# Patient Record
Sex: Female | Born: 1985 | Race: Black or African American | Hispanic: No | Smoking: Never smoker
Health system: Southern US, Community
[De-identification: ages and names within clinical notes are randomized; demographics above are authoritative.]

## PROBLEM LIST (undated history)

## (undated) DIAGNOSIS — D573 Sickle-cell trait: Secondary | ICD-10-CM

## (undated) DIAGNOSIS — R87629 Unspecified abnormal cytological findings in specimens from vagina: Secondary | ICD-10-CM

## (undated) HISTORY — DX: Unspecified abnormal cytological findings in specimens from vagina: R87.629

## (undated) HISTORY — DX: Sickle-cell trait: D57.3

---
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
## 2006-02-16 ENCOUNTER — Emergency Department: Payer: Self-pay | Admitting: Emergency Medicine

## 2006-03-29 ENCOUNTER — Emergency Department: Payer: Self-pay | Admitting: General Practice

## 2006-05-11 ENCOUNTER — Emergency Department: Payer: Self-pay | Admitting: Emergency Medicine

## 2006-06-08 ENCOUNTER — Observation Stay: Payer: Self-pay

## 2006-09-28 ENCOUNTER — Inpatient Hospital Stay: Payer: Self-pay | Admitting: Obstetrics and Gynecology

## 2007-06-23 ENCOUNTER — Emergency Department: Payer: Self-pay | Admitting: Emergency Medicine

## 2007-06-24 ENCOUNTER — Emergency Department: Payer: Self-pay | Admitting: Emergency Medicine

## 2008-11-09 ENCOUNTER — Emergency Department: Payer: Self-pay | Admitting: Emergency Medicine

## 2009-01-17 ENCOUNTER — Ambulatory Visit: Payer: Self-pay | Admitting: Family Medicine

## 2010-01-05 HISTORY — PX: TONSILLECTOMY: SUR1361

## 2010-10-09 IMAGING — CT CT NECK WITH CONTRAST
1 of 2 series · 9 of 14 positions shown, 12 images · non-contrast
Comparison: none

REASON FOR EXAM: increasing throat pain, swelling, pus sp tonsillectomy
COMMENTS:

[Series 3: soft tissue · axial · 0.45mm/px · z∈[-346,-130]mm · 9 of 90 slices shown, 12 images]
[im 9/90  soft-tissue]
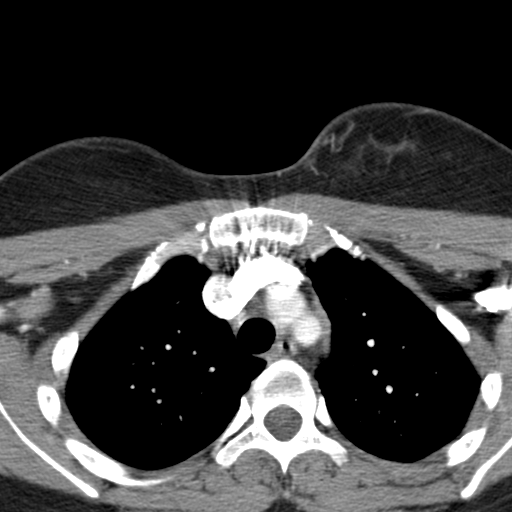
[im 9/90  bone]
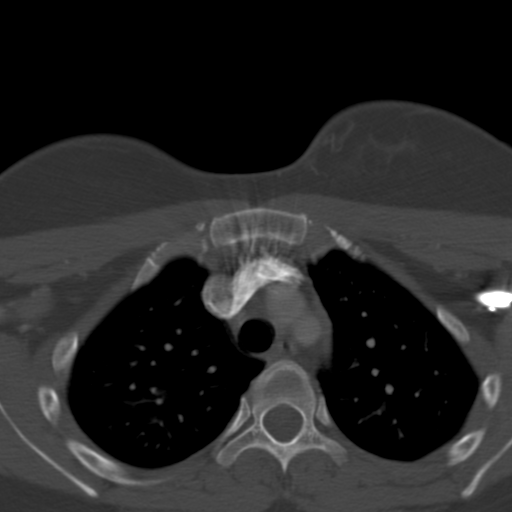
[im 18/90  bone]
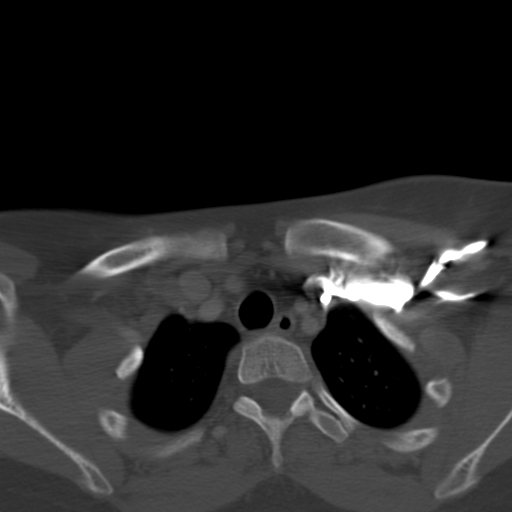
[im 27/90  bone]
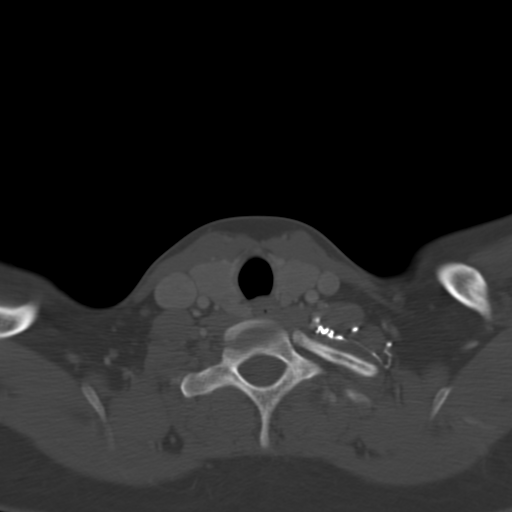
[im 36/90  bone]
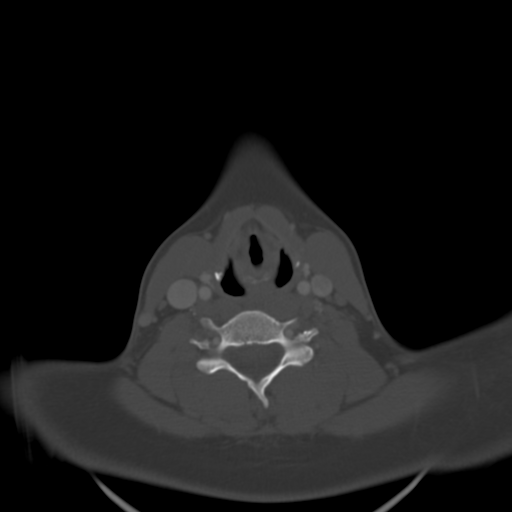
[im 45/90  soft-tissue]
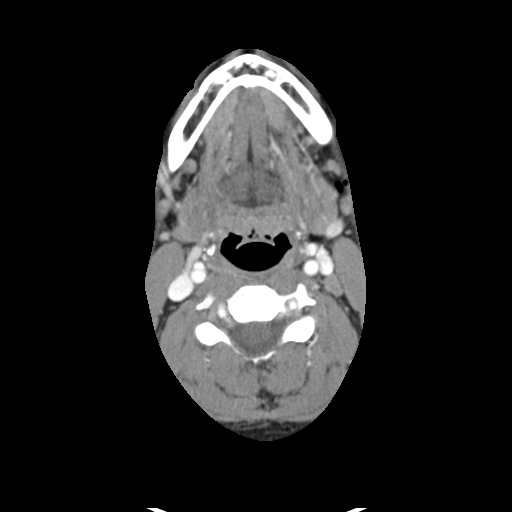
[im 45/90  bone]
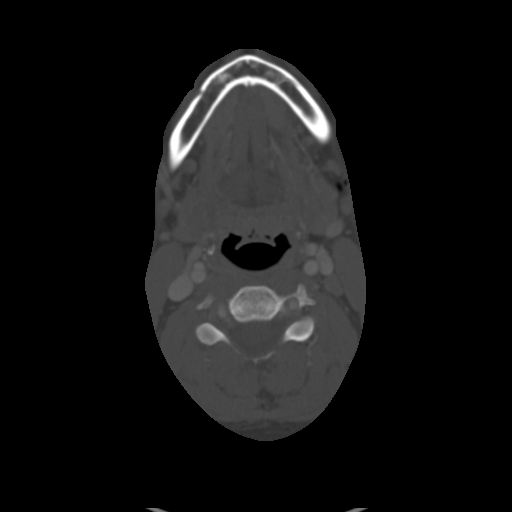
[im 54/90  bone]
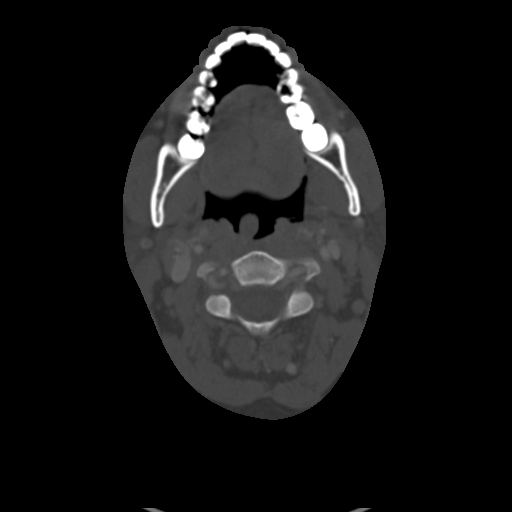
[im 63/90  bone]
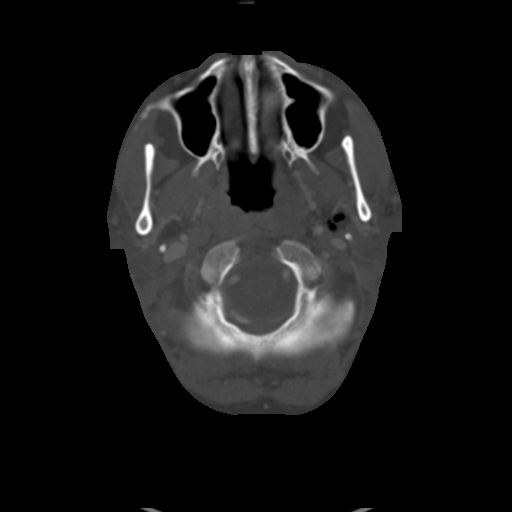
[im 72/90  bone]
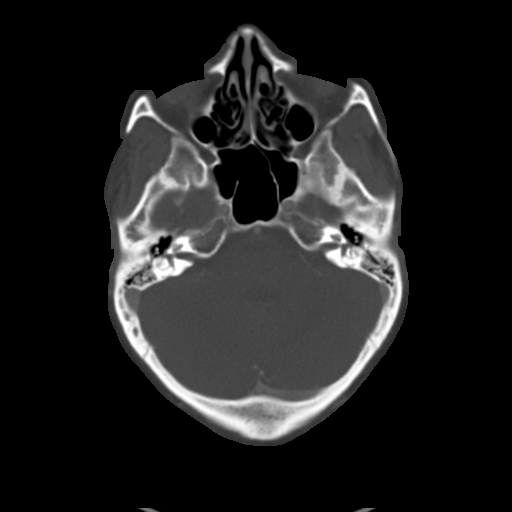
[im 81/90  soft-tissue]
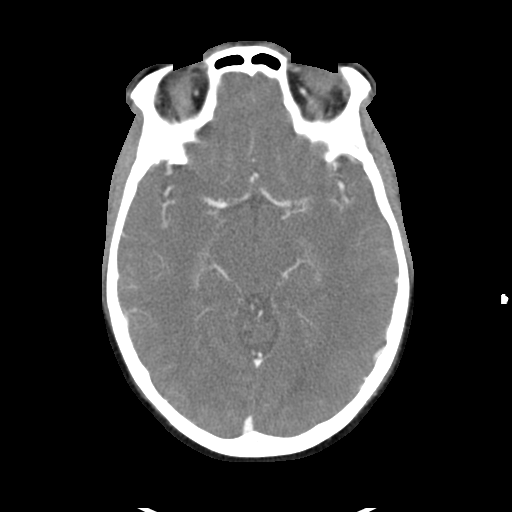
[im 81/90  bone]
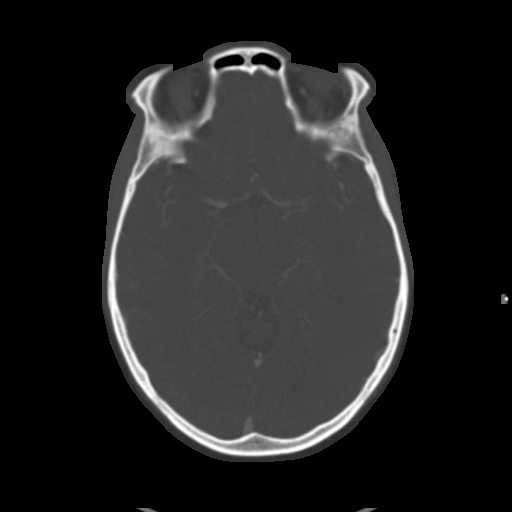

[9 of 14 positions shown; findings below may reference images not displayed]

PROCEDURE:     CT  - CT NECK WITH CONTRAST  - November 09, 2008 [DATE]

RESULT:     Emergent CT of the neck is performed utilizing 70 ml of
Csovue-N25 iodinated intravenous contrast. Images are reconstructed at 3 mm
slice thickness. There is no previous exam for comparison.

Images through the base of the brain show no significant abnormality. The
included sinuses and mastoids show normal aeration. The parotid glands,
submandibular glands and thyroid lobes appear unremarkable. The superior
mediastinum is within normal limits. The apical portions of the lungs are
clear. There is no narrowing of the airway. The larynx appears unremarkable.
There is no abscess evident. The uvula is slightly prominent. This is
nonspecific. Correlate clinically. There is a small mucous retention cyst in
the left maxillary sinus. The prevertebral soft tissues are normal. There is
a tiny amount of air in the right parapharyngeal space which could be
secondary to the recent tonsillectomy. There is some air in the soft tissues
medial to the mandibular angle and ramus extending inferiorly to the
superficial area over the left submandibular gland. Again, there is no
evidence of an abscess associated with these air collections. No other fluid
collection such as a hematoma or seroma is evident. The jugular veins and
common carotid arteries appear to be unremarkable. The visualized internal
carotids appear to be within normal limits.
IMPRESSION: No evidence of an abscess. There is air within the soft
tissues as described. This could be secondary to recent surgery. Infection
is not completely excluded. At this point, however, there is no evidence of
an abscess or hematoma. The uvula is somewhat prominent. Correlate
clinically.

## 2011-03-28 ENCOUNTER — Emergency Department: Payer: Self-pay | Admitting: Emergency Medicine

## 2011-08-16 ENCOUNTER — Emergency Department: Payer: Self-pay | Admitting: Internal Medicine

## 2014-07-04 ENCOUNTER — Encounter: Payer: Self-pay | Admitting: Emergency Medicine

## 2014-07-04 ENCOUNTER — Emergency Department
Admission: EM | Admit: 2014-07-04 | Discharge: 2014-07-04 | Disposition: A | Discharge status: Home / Self Care | Payer: Medicaid Other | Attending: Emergency Medicine | Admitting: Emergency Medicine

## 2014-07-04 DIAGNOSIS — Z3202 Encounter for pregnancy test, result negative: Secondary | ICD-10-CM | POA: Diagnosis not present

## 2014-07-04 DIAGNOSIS — N390 Urinary tract infection, site not specified: Secondary | ICD-10-CM

## 2014-07-04 DIAGNOSIS — E876 Hypokalemia: Secondary | ICD-10-CM | POA: Diagnosis not present

## 2014-07-04 DIAGNOSIS — R300 Dysuria: Secondary | ICD-10-CM | POA: Diagnosis present

## 2014-07-04 DIAGNOSIS — N12 Tubulo-interstitial nephritis, not specified as acute or chronic: Secondary | ICD-10-CM

## 2014-07-04 LAB — CBC WITH DIFFERENTIAL/PLATELET
BASOS PCT: 0 %
Basophils Absolute: 0 10*3/uL (ref 0–0.1)
EOS ABS: 0 10*3/uL (ref 0–0.7)
EOS PCT: 0 %
HCT: 39.7 % (ref 35.0–47.0)
Hemoglobin: 12.8 g/dL (ref 12.0–16.0)
Lymphocytes Relative: 6 %
Lymphs Abs: 0.8 10*3/uL — ABNORMAL LOW (ref 1.0–3.6)
MCH: 28.6 pg (ref 26.0–34.0)
MCHC: 32.2 g/dL (ref 32.0–36.0)
MCV: 88.7 fL (ref 80.0–100.0)
MONOS PCT: 8 %
Monocytes Absolute: 1.1 10*3/uL — ABNORMAL HIGH (ref 0.2–0.9)
Neutro Abs: 11.1 10*3/uL — ABNORMAL HIGH (ref 1.4–6.5)
Neutrophils Relative %: 86 %
PLATELETS: 304 10*3/uL (ref 150–440)
RBC: 4.47 MIL/uL (ref 3.80–5.20)
RDW: 13.3 % (ref 11.5–14.5)
WBC: 13 10*3/uL — ABNORMAL HIGH (ref 3.6–11.0)

## 2014-07-04 LAB — URINALYSIS COMPLETE WITH MICROSCOPIC (ARMC ONLY)
BILIRUBIN URINE: NEGATIVE
Glucose, UA: >500 mg/dL — AB
KETONES UR: NEGATIVE mg/dL
Nitrite: NEGATIVE
Protein, ur: NEGATIVE mg/dL
Specific Gravity, Urine: 1.006 (ref 1.005–1.030)
Squamous Epithelial / LPF: NONE SEEN
pH: 6 (ref 5.0–8.0)

## 2014-07-04 LAB — BASIC METABOLIC PANEL
ANION GAP: 10 (ref 5–15)
BUN: 6 mg/dL (ref 6–20)
CALCIUM: 9 mg/dL (ref 8.9–10.3)
CO2: 24 mmol/L (ref 22–32)
CREATININE: 0.77 mg/dL (ref 0.44–1.00)
Chloride: 105 mmol/L (ref 101–111)
GFR calc non Af Amer: >60 mL/min (ref >60)
Glucose, Bld: 117 mg/dL — ABNORMAL HIGH (ref 65–99)
Potassium: 3.2 mmol/L — ABNORMAL LOW (ref 3.5–5.1)
Sodium: 139 mmol/L (ref 135–145)

## 2014-07-04 LAB — POCT PREGNANCY, URINE: PREG TEST UR: NEGATIVE

## 2014-07-04 LAB — GLUCOSE, CAPILLARY: Glucose-Capillary: 86 mg/dL (ref 65–99)

## 2014-07-04 MED ORDER — CEFTRIAXONE SODIUM IN DEXTROSE 20 MG/ML IV SOLN
INTRAVENOUS | Status: AC
  Filled 2014-07-04: qty 50

## 2014-07-04 MED ORDER — PHENAZOPYRIDINE HCL 200 MG PO TABS: 200.0000 mg | ORAL_TABLET | Freq: Three times a day (TID) | ORAL | Status: DC | PRN

## 2014-07-04 MED ORDER — SODIUM CHLORIDE 0.9 % IV BOLUS (SEPSIS)
1000.0000 mL | Freq: Once | INTRAVENOUS | Status: AC
  Administered 2014-07-04: 1000 mL via INTRAVENOUS

## 2014-07-04 MED ORDER — KETOROLAC TROMETHAMINE 30 MG/ML IJ SOLN
30.0000 mg | Freq: Once | INTRAMUSCULAR | Status: AC
  Administered 2014-07-04: 30 mg via INTRAVENOUS

## 2014-07-04 MED ORDER — CIPROFLOXACIN HCL 500 MG PO TABS: 500.0000 mg | ORAL_TABLET | Freq: Two times a day (BID) | ORAL | Status: AC

## 2014-07-04 MED ORDER — KETOROLAC TROMETHAMINE 10 MG PO TABS: 10.0000 mg | ORAL_TABLET | Freq: Four times a day (QID) | ORAL | Status: DC | PRN

## 2014-07-04 MED ORDER — CEFTRIAXONE SODIUM IN DEXTROSE 20 MG/ML IV SOLN
1.0000 g | INTRAVENOUS | Status: DC
  Administered 2014-07-04: 1 g via INTRAVENOUS

## 2014-07-04 MED ORDER — POTASSIUM CHLORIDE ER 10 MEQ PO TBCR: 10.0000 meq | EXTENDED_RELEASE_TABLET | Freq: Every day | ORAL | Status: DC

## 2014-07-04 NOTE — ED Notes (Signed)
Pt c/o burning upon urination with urinary frequency x 1 week, states yesterday she had chills and body aches

## 2014-07-04 NOTE — ED Notes (Signed)
Pt complains of right sided flank pain, pt states burning and malodors urine for 1 week, pt alert and oriented upon assessment

## 2014-07-04 NOTE — ED Provider Notes (Signed)
[facility] Emergency Department Provider Note  ____________________________________________  Time seen: 1042   I have reviewed the triage vital signs and the nursing notes.   HISTORY  Chief Complaint Dysuria and Fever    HPI Heather Barrett is a 29 y.o. female who is had a week's worth of burning with urination now she states that she has pain up in her right side fevers chills sweats denies vaginal bleeding or discharge says her body started aching she has some muscle cramping rates her overall pain and discomfort as about 8 out of 10 nothing making it better or worse denies nausea vomiting diarrhea or any other complaints of note here today for further evaluation and treatment   No past medical history on file.  There are no active problems to display for this patient.   No past surgical history on file.  Current Outpatient Rx  Name  Route  Sig  Dispense  Refill  . ciprofloxacin (CIPRO) 500 MG tablet   Oral   Take 1 tablet (500 mg total) by mouth 2 (two) times daily.   14 tablet   0   . ketorolac (TORADOL) 10 MG tablet   Oral   Take 1 tablet (10 mg total) by mouth every 6 (six) hours as needed.   20 tablet   0   . phenazopyridine (PYRIDIUM) 200 MG tablet   Oral   Take 1 tablet (200 mg total) by mouth 3 (three) times daily as needed for pain.   10 tablet   0   . potassium chloride (K-DUR) 10 MEQ tablet   Oral   Take 1 tablet (10 mEq total) by mouth daily.   5 tablet   0     Allergies Review of patient's allergies indicates no known allergies.  No family history on file.  Social History History  Substance Use Topics  . Smoking status: Never Smoker   . Smokeless tobacco: Not on file  . Alcohol Use: Yes    Review of Systems Constitutional: No fever/chills Eyes: No visual changes. ENT: No sore throat. Cardiovascular: Denies chest pain. Respiratory: Denies shortness of breath. Gastrointestinal: No abdominal pain.  No  nausea, no vomiting.  No diarrhea.  No constipation. Genitourinary: dysuria. Musculoskeletal: Negative for back pain. Skin: Negative for rash. Neurological: Negative for headaches, focal weakness or numbness.  10-point ROS otherwise negative.  ____________________________________________   PHYSICAL EXAM:  VITAL SIGNS: ED Triage Vitals  Enc Vitals Group     BP 07/04/14 1034 131/91 mmHg     Pulse Rate 07/04/14 1034 116     Resp 07/04/14 1034 18     Temp 07/04/14 1034 100.2 F (37.9 C)     Temp Source 07/04/14 1034 Oral     SpO2 07/04/14 1034 100 %     Weight 07/04/14 1034 157 lb (71.215 kg)     Height 07/04/14 1034  (1.676 m)     Head Cir --      Peak Flow --      Pain Score 07/04/14 1035 7     Pain Loc --      Pain Edu? --      Excl. in GC? --     Constitutional: Alert and oriented. Well appearing and in no acute distress. Eyes: Conjunctivae are normal. PERRL. EOMI. Head: Atraumatic. Nose: No congestion/rhinnorhea. Mouth/Throat: Mucous membranes are moist.  Oropharynx non-erythematous. Neck: No stridor.   Cardiovascular: Normal rate, regular rhythm. Grossly normal heart sounds.  Good peripheral circulation.  Respiratory: Normal respiratory effort.  No retractions. Lungs CTAB. Gastrointestinal: Soft and suprapubic pain. No distention. No abdominal bruits. right CVA tenderness. Musculoskeletal: No lower extremity tenderness nor edema.  No joint effusions. Neurologic:  Normal speech and language. No gross focal neurologic deficits are appreciated. Speech is normal. No gait instability. Skin:  Skin is warm, dry and intact. No rash noted. Psychiatric: Mood and affect are normal. Speech and behavior are normal.  ____________________________________________   LABS (all labs ordered are listed, but only abnormal results are displayed)  Labs Reviewed  URINALYSIS COMPLETEWITH MICROSCOPIC ([facility] ONLY) - Abnormal; Notable for the following:    Color, Urine YELLOW (*)     APPearance CLOUDY (*)    Glucose, UA >500 (*)    Hgb urine dipstick 2+ (*)    Leukocytes, UA 3+ (*)    Bacteria, UA FEW (*)    All other components within normal limits  BASIC METABOLIC PANEL - Abnormal; Notable for the following:    Potassium 3.2 (*)    Glucose, Bld 117 (*)    All other components within normal limits  CBC WITH DIFFERENTIAL/PLATELET - Abnormal; Notable for the following:    WBC 13.0 (*)    Neutro Abs 11.1 (*)    Lymphs Abs 0.8 (*)    Monocytes Absolute 1.1 (*)    All other components within normal limits  URINE CULTURE  GLUCOSE, CAPILLARY  POCT PREGNANCY, URINE    PROCEDURES  Procedure(s) performed: None  Critical Care performed: No  ____________________________________________   INITIAL IMPRESSION / ASSESSMENT AND PLAN / ED COURSE  Pertinent labs & imaging results that were available during my care of the patient were reviewed by me and considered in my medical decision making (see chart for details).  Initial impression on this patient pyelonephritis given her symptoms fever tachycardia and exam we started her with fluids and IV antibiotics medication for pain and discomfort she is to follow-up in 2-3 days return here for any acute concerns or worsening symptoms given prescriptions for Cipro Toradol Pyridium and K-Dur ____________________________________________   FINAL CLINICAL IMPRESSION(S) / ED DIAGNOSES  Final diagnoses:  UTI (lower urinary tract infection)  Pyelonephritis  Hypokalemia     Roberts Bon Rosalyn GessWilliam C Nyia Tsao, PA-C 07/04/14 1318  Phineas SemenGraydon Goodman, MD 07/04/14 1353

## 2014-07-05 ENCOUNTER — Encounter: Payer: Self-pay | Admitting: General Practice

## 2014-07-05 ENCOUNTER — Emergency Department
Admission: EM | Admit: 2014-07-05 | Discharge: 2014-07-05 | Disposition: A | Discharge status: Home / Self Care | Payer: Medicaid Other | Attending: Emergency Medicine | Admitting: Emergency Medicine

## 2014-07-05 DIAGNOSIS — R509 Fever, unspecified: Secondary | ICD-10-CM

## 2014-07-05 DIAGNOSIS — N390 Urinary tract infection, site not specified: Secondary | ICD-10-CM | POA: Diagnosis not present

## 2014-07-05 DIAGNOSIS — R51 Headache: Secondary | ICD-10-CM | POA: Diagnosis not present

## 2014-07-05 DIAGNOSIS — Z79899 Other long term (current) drug therapy: Secondary | ICD-10-CM | POA: Diagnosis not present

## 2014-07-05 DIAGNOSIS — Z3202 Encounter for pregnancy test, result negative: Secondary | ICD-10-CM | POA: Diagnosis not present

## 2014-07-05 DIAGNOSIS — Z792 Long term (current) use of antibiotics: Secondary | ICD-10-CM | POA: Insufficient documentation

## 2014-07-05 LAB — URINALYSIS COMPLETE WITH MICROSCOPIC (ARMC ONLY)
Bilirubin Urine: NEGATIVE
Glucose, UA: 50 mg/dL — AB
Ketones, ur: NEGATIVE mg/dL
Nitrite: NEGATIVE
PH: 6 (ref 5.0–8.0)
Protein, ur: 30 mg/dL — AB
SPECIFIC GRAVITY, URINE: 1.01 (ref 1.005–1.030)

## 2014-07-05 LAB — CBC
HCT: 37.5 % (ref 35.0–47.0)
Hemoglobin: 12.6 g/dL (ref 12.0–16.0)
MCH: 29.1 pg (ref 26.0–34.0)
MCHC: 33.5 g/dL (ref 32.0–36.0)
MCV: 86.8 fL (ref 80.0–100.0)
Platelets: 258 10*3/uL (ref 150–440)
RBC: 4.32 MIL/uL (ref 3.80–5.20)
RDW: 13.3 % (ref 11.5–14.5)
WBC: 9.1 10*3/uL (ref 3.6–11.0)

## 2014-07-05 LAB — BASIC METABOLIC PANEL
ANION GAP: 8 (ref 5–15)
BUN: 6 mg/dL (ref 6–20)
CALCIUM: 8.6 mg/dL — AB (ref 8.9–10.3)
CO2: 24 mmol/L (ref 22–32)
CREATININE: 0.73 mg/dL (ref 0.44–1.00)
Chloride: 105 mmol/L (ref 101–111)
GFR calc non Af Amer: >60 mL/min (ref >60)
GLUCOSE: 99 mg/dL (ref 65–99)
POTASSIUM: 3.2 mmol/L — AB (ref 3.5–5.1)
SODIUM: 137 mmol/L (ref 135–145)

## 2014-07-05 LAB — POCT PREGNANCY, URINE: PREG TEST UR: NEGATIVE

## 2014-07-05 MED ORDER — ACETAMINOPHEN 500 MG PO TABS
ORAL_TABLET | ORAL | Status: AC
  Filled 2014-07-05: qty 1

## 2014-07-05 MED ORDER — NITROFURANTOIN MACROCRYSTAL 100 MG PO CAPS
ORAL_CAPSULE | ORAL | Status: AC
  Filled 2014-07-05: qty 1

## 2014-07-05 MED ORDER — KETOROLAC TROMETHAMINE 30 MG/ML IJ SOLN
30.0000 mg | Freq: Once | INTRAMUSCULAR | Status: AC
  Administered 2014-07-05: 30 mg via INTRAVENOUS

## 2014-07-05 MED ORDER — SODIUM CHLORIDE 0.9 % IV BOLUS (SEPSIS)
1000.0000 mL | Freq: Once | INTRAVENOUS | Status: AC
  Administered 2014-07-05: 1000 mL via INTRAVENOUS

## 2014-07-05 MED ORDER — ACETAMINOPHEN 500 MG PO TABS
500.0000 mg | ORAL_TABLET | Freq: Once | ORAL | Status: AC
  Administered 2014-07-05: 500 mg via ORAL

## 2014-07-05 MED ORDER — NITROFURANTOIN MONOHYD MACRO 100 MG PO CAPS: 100.0000 mg | ORAL_CAPSULE | Freq: Two times a day (BID) | ORAL | Status: AC

## 2014-07-05 MED ORDER — NITROFURANTOIN MACROCRYSTAL 100 MG PO CAPS
ORAL_CAPSULE | ORAL | Status: DC
Start: 2014-07-05 — End: 2014-07-06
  Filled 2014-07-05: qty 1

## 2014-07-05 MED ORDER — KETOROLAC TROMETHAMINE 30 MG/ML IJ SOLN
INTRAMUSCULAR | Status: AC
  Administered 2014-07-05: 30 mg via INTRAVENOUS
  Filled 2014-07-05: qty 1

## 2014-07-05 MED ORDER — NITROFURANTOIN MONOHYD MACRO 100 MG PO CAPS
100.0000 mg | ORAL_CAPSULE | Freq: Once | ORAL | Status: AC
  Administered 2014-07-05: 100 mg via ORAL

## 2014-07-05 NOTE — Discharge Instructions (Signed)
Please seek medical attention for any high fevers, chest pain, shortness of breath, change in behavior, persistent vomiting, bloody stool or any other new or concerning symptoms. ° °Urinary Tract Infection °Urinary tract infections (UTIs) can develop anywhere along your urinary tract. Your urinary tract is your body's drainage system for removing wastes and extra water. Your urinary tract includes two kidneys, two ureters, a bladder, and a urethra. Your kidneys are a pair of bean-shaped organs. Each kidney is about the size of your fist. They are located below your ribs, one on each side of your spine. °CAUSES °Infections are caused by microbes, which are microscopic organisms, including fungi, viruses, and bacteria. These organisms are so small that they can only be seen through a microscope. Bacteria are the microbes that most commonly cause UTIs. °SYMPTOMS  °Symptoms of UTIs may vary by age and gender of the patient and by the location of the infection. Symptoms in young women typically include a frequent and intense urge to urinate and a painful, burning feeling in the bladder or urethra during urination. Older women and men are more likely to be tired, shaky, and weak and have muscle aches and abdominal pain. A fever may mean the infection is in your kidneys. Other symptoms of a kidney infection include pain in your back or sides below the ribs, nausea, and vomiting. °DIAGNOSIS °To diagnose a UTI, your caregiver will ask you about your symptoms. Your caregiver also will ask to provide a urine sample. The urine sample will be tested for bacteria and white blood cells. White blood cells are made by your body to help fight infection. °TREATMENT  °Typically, UTIs can be treated with medication. Because most UTIs are caused by a bacterial infection, they usually can be treated with the use of antibiotics. The choice of antibiotic and length of treatment depend on your symptoms and the type of bacteria causing your  infection. °HOME CARE INSTRUCTIONS °· If you were prescribed antibiotics, take them exactly as your caregiver instructs you. Finish the medication even if you feel better after you have only taken some of the medication. °· Drink enough water and fluids to keep your urine clear or pale yellow. °· Avoid caffeine, tea, and carbonated beverages. They tend to irritate your bladder. °· Empty your bladder often. Avoid holding urine for long periods of time. °· Empty your bladder before and after sexual intercourse. °· After a bowel movement, women should cleanse from front to back. Use each tissue only once. °SEEK MEDICAL CARE IF:  °· You have back pain. °· You develop a fever. °· Your symptoms do not begin to resolve within 3 days. °SEEK IMMEDIATE MEDICAL CARE IF:  °· You have severe back pain or lower abdominal pain. °· You develop chills. °· You have nausea or vomiting. °· You have continued burning or discomfort with urination. °MAKE SURE YOU:  °· Understand these instructions. °· Will watch your condition. °· Will get help right away if you are not doing well or get worse. °Document Released: 10/01/2004 Document Revised: 06/23/2011 Document Reviewed: 01/30/2011 °[employer]® Patient Information ©2015 [employer]. This information is not intended to replace advice given to you by your health care provider. Make sure you discuss any questions you have with your health care provider. ° °

## 2014-07-05 NOTE — ED Provider Notes (Signed)
[facility] Emergency Department Provider Note   ____________________________________________  Time seen: 1930  I have reviewed the triage vital signs and the nursing notes.   HISTORY  Chief Complaint Fever and Headache   History limited by: Not Limited   HPI [name] is a 29 y.o. female who presents to the emergency department because of concerns for fever, weakness and headache. The patient was seen in the emergency department yesterday and diagnosed with UTI. She states she's been taking the medications as prescribed and does have some improvement of the dysuria. However she states that she continues to have fevers. She has been trying Tylenol at home with only temporary relief fevers. Additionally she now says she is feeling weak especially when she stands up. She thinks she has been drinking enough.    History reviewed. No pertinent past medical history.  There are no active problems to display for this patient.   Past Surgical History  Procedure Laterality Date  . Cesarean section      Current Outpatient Rx  Name  Route  Sig  Dispense  Refill  . ciprofloxacin (CIPRO) 500 MG tablet   Oral   Take 1 tablet (500 mg total) by mouth 2 (two) times daily.   14 tablet   0   . ketorolac (TORADOL) 10 MG tablet   Oral   Take 1 tablet (10 mg total) by mouth every 6 (six) hours as needed.   20 tablet   0   . phenazopyridine (PYRIDIUM) 200 MG tablet   Oral   Take 1 tablet (200 mg total) by mouth 3 (three) times daily as needed for pain.   10 tablet   0   . potassium chloride (K-DUR) 10 MEQ tablet   Oral   Take 1 tablet (10 mEq total) by mouth daily.   5 tablet   0     Allergies Review of patient's allergies indicates no known allergies.  No family history on file.  Social History History  Substance Use Topics  . Smoking status: Never Smoker   . Smokeless tobacco: Not on file  . Alcohol Use: Yes    Review of  Systems  Constitutional: Negative for fever. Cardiovascular: Negative for chest pain. Respiratory: Negative for shortness of breath. Gastrointestinal: Negative for abdominal pain, vomiting and diarrhea. Genitourinary: Dysuria is improving Musculoskeletal: Negative for back pain. Skin: Negative for rash. Neurological: Negative for headaches, focal weakness or numbness.  10-point ROS otherwise negative.  ____________________________________________   PHYSICAL EXAM:  VITAL SIGNS: ED Triage Vitals  Enc Vitals Group     BP 07/05/14 1836 125/79 mmHg     Pulse Rate 07/05/14 1836 110     Resp 07/05/14 1836 18     Temp 07/05/14 1836 100.6 F (38.1 C)     Temp Source 07/05/14 1836 Oral     SpO2 07/05/14 1836 99 %     Weight 07/05/14 1836 157 lb (71.215 kg)     Height 07/05/14 1836 5\' 6"  (1.676 m)     Head Cir --      Peak Flow --      Pain Score 07/05/14 1837 7   Constitutional: Alert and oriented. Well appearing and in no distress. Eyes: Conjunctivae are normal. PERRL. Normal extraocular movements. ENT   Head: Normocephalic and atraumatic.   Nose: No congestion/rhinnorhea.   Mouth/Throat: Mucous membranes are moist.   Neck: No stridor. Hematological/Lymphatic/Immunilogical: No cervical lymphadenopathy. Cardiovascular: Normal rate, regular rhythm.  No murmurs, rubs, or  gallops. Respiratory: Normal respiratory effort without tachypnea nor retractions. Breath sounds are clear and equal bilaterally. No wheezes/rales/rhonchi. Gastrointestinal: Soft and nontender. No distention. There is no CVA tenderness. Genitourinary: Deferred Musculoskeletal: Normal range of motion in all extremities. No joint effusions.  No lower extremity tenderness nor edema. Neurologic:  Normal speech and language. No gross focal neurologic deficits are appreciated. Speech is normal.  Skin:  Skin is warm, dry and intact. No rash noted. Psychiatric: Mood and affect are normal. Speech and behavior  are normal. Patient exhibits appropriate insight and judgment.  ____________________________________________    LABS (pertinent positives/negatives)  Labs Reviewed  BASIC METABOLIC PANEL - Abnormal; Notable for the following:    Potassium 3.2 (*)    Calcium 8.6 (*)    All other components within normal limits  URINALYSIS COMPLETEWITH MICROSCOPIC ([facility] ONLY) - Abnormal; Notable for the following:    Color, Urine YELLOW (*)    APPearance CLEAR (*)    Glucose, UA 50 (*)    Hgb urine dipstick 2+ (*)    Protein, ur 30 (*)    Leukocytes, UA TRACE (*)    Bacteria, UA RARE (*)    Squamous Epithelial / LPF 0-5 (*)    All other components within normal limits  CBC  POC URINE PREG, ED  POCT PREGNANCY, URINE     ____________________________________________   EKG  None  ____________________________________________    RADIOLOGY  None  ____________________________________________   PROCEDURES  Procedure(s) performed: None  Critical Care performed: No  ____________________________________________   INITIAL IMPRESSION / ASSESSMENT AND PLAN / ED COURSE  Pertinent labs & imaging results that were available during my care of the patient were reviewed by me and considered in my medical decision making (see chart for details).  She presents with continued fever, headache and pain. Patient was diagnosed with UTI yesterday and placed on Cipro. On exam patient no acute distress. No signs of meningismus. Patient did feel better after fluids and Toradol. Will switch patient to Macrobid. Did discuss possibility of meningitis with patient however at this point I think this possibly very low thus will defer spinal tap.  ____________________________________________   FINAL CLINICAL IMPRESSION(S) / ED DIAGNOSES  Final diagnoses:  UTI (lower urinary tract infection)  Fever, unspecified fever cause     Phineas Semen, MD 07/05/14 2123

## 2014-07-05 NOTE — ED Notes (Signed)
Pt. Arrived to ed from home with reports of experiencing a headache and fever. Pt states she was here yesterday and is currently being treated for a UTI and hypokalemia. Pt states "i aint getting no better, i can not break this fever".  Pt alert and oriented. Pt verbalized improvement in urinary symptoms but states "my head still hurts and my fever". Pt placed on oral antibiotics and potassium pills.

## 2014-07-05 NOTE — ED Notes (Signed)
Pt. Was seen here yesterday and dx with UTI and hypokaliemia.  Pt. States having HA and fever for the past two days.  Pt. States taking Tylenol to treat fever and HA.  Pt. States no relief for fever and HA.  Pt. States having some relief from UTI symptoms.

## 2014-07-06 ENCOUNTER — Telehealth: Payer: Self-pay | Admitting: Emergency Medicine

## 2014-07-06 LAB — URINE CULTURE: Culture: >=100000

## 2014-07-06 NOTE — ED Provider Notes (Signed)
-----------------------------------------   9:29 AM on 07/06/2014 -----------------------------------------  Urine culture reviewed. Records are reviewed.  Patient initially treated with ciprofloxacin. The patient return to the emergency department due to ongoing fevers and was changed from Cipro to Macrobid. Of note, her initial urinalysis showed white blood cells too numerous to count, and the second urinalysis showed white blood cells 6-30. It appeared she was having improvement with the Cipro. The culture now returns with ESBL Escherichia coli that is intermittent resistant to Macrobid. The culture does not make any comment about fluoroquinolones. I am asking the nursing staff to call the patient and asked her to restart the Cipro and take it in conjunction with the Macrobid given the culture results. The last patient to follow-up with a primary physician for further evaluation and ongoing care.   Darien Ramusavid W Nhia Heaphy, MD 07/06/14 0930

## 2014-07-06 NOTE — ED Notes (Signed)
Urine culture reviewed by Dr Carollee MassedKaminski.  Called patient at request of Dr. Carollee MassedKaminski to instruct to take both cipro and macrobid rxs until complete.  Left message with instructions, and asked her to call me back today if possible.

## 2015-05-14 ENCOUNTER — Emergency Department
Admission: EM | Admit: 2015-05-14 | Discharge: 2015-05-14 | Disposition: A | Discharge status: Home / Self Care | Payer: Medicaid Other | Attending: Emergency Medicine | Admitting: Emergency Medicine

## 2015-05-14 DIAGNOSIS — Z79899 Other long term (current) drug therapy: Secondary | ICD-10-CM | POA: Insufficient documentation

## 2015-05-14 DIAGNOSIS — B9689 Other specified bacterial agents as the cause of diseases classified elsewhere: Secondary | ICD-10-CM

## 2015-05-14 DIAGNOSIS — N760 Acute vaginitis: Secondary | ICD-10-CM | POA: Insufficient documentation

## 2015-05-14 DIAGNOSIS — N3000 Acute cystitis without hematuria: Secondary | ICD-10-CM

## 2015-05-14 LAB — COMPREHENSIVE METABOLIC PANEL
ALT: 17 U/L (ref 14–54)
AST: 18 U/L (ref 15–41)
Albumin: 4.5 g/dL (ref 3.5–5.0)
Alkaline Phosphatase: 78 U/L (ref 38–126)
Anion gap: 7 (ref 5–15)
BUN: 12 mg/dL (ref 6–20)
CALCIUM: 9.7 mg/dL (ref 8.9–10.3)
CO2: 26 mmol/L (ref 22–32)
CREATININE: 0.72 mg/dL (ref 0.44–1.00)
Chloride: 106 mmol/L (ref 101–111)
GFR calc Af Amer: >60 mL/min (ref >60)
GFR calc non Af Amer: >60 mL/min (ref >60)
GLUCOSE: 95 mg/dL (ref 65–99)
Potassium: 3.2 mmol/L — ABNORMAL LOW (ref 3.5–5.1)
Sodium: 139 mmol/L (ref 135–145)
Total Bilirubin: 0.7 mg/dL (ref 0.3–1.2)
Total Protein: 7.7 g/dL (ref 6.5–8.1)

## 2015-05-14 LAB — CBC
HCT: 44.8 % (ref 35.0–47.0)
Hemoglobin: 14.6 g/dL (ref 12.0–16.0)
MCH: 28.9 pg (ref 26.0–34.0)
MCHC: 32.6 g/dL (ref 32.0–36.0)
MCV: 88.7 fL (ref 80.0–100.0)
PLATELETS: 281 10*3/uL (ref 150–440)
RBC: 5.05 MIL/uL (ref 3.80–5.20)
RDW: 13.8 % (ref 11.5–14.5)
WBC: 10.4 10*3/uL (ref 3.6–11.0)

## 2015-05-14 LAB — URINALYSIS COMPLETE WITH MICROSCOPIC (ARMC ONLY)
Bilirubin Urine: NEGATIVE
GLUCOSE, UA: NEGATIVE mg/dL
Ketones, ur: NEGATIVE mg/dL
Nitrite: NEGATIVE
PH: 5 (ref 5.0–8.0)
Protein, ur: NEGATIVE mg/dL
Specific Gravity, Urine: 1.012 (ref 1.005–1.030)

## 2015-05-14 LAB — WET PREP, GENITAL
Sperm: NONE SEEN
Trich, Wet Prep: NONE SEEN
Yeast Wet Prep HPF POC: NONE SEEN

## 2015-05-14 LAB — POCT PREGNANCY, URINE: PREG TEST UR: NEGATIVE

## 2015-05-14 MED ORDER — CIPROFLOXACIN HCL 500 MG PO TABS: 500.0000 mg | ORAL_TABLET | Freq: Two times a day (BID) | ORAL | Status: DC

## 2015-05-14 MED ORDER — METRONIDAZOLE 500 MG PO TABS: 500.0000 mg | ORAL_TABLET | Freq: Two times a day (BID) | ORAL | Status: DC

## 2015-05-14 NOTE — ED Notes (Signed)
Pt in with co foul smelling urine x 1 week, diarrhea since yest and vaginal bleeding x 1  Month.

## 2015-05-14 NOTE — ED Provider Notes (Signed)
[facility] Emergency Department Provider Note  ____________________________________________  Time seen: Approximately 10:40 PM  I have reviewed the triage vital signs and the nursing notes.   HISTORY  Chief Complaint Weakness    HPI Jayne Peckenpaugh is a 30 y.o. female who presents to the emergency department for vaginal bleeding and dysuria. Foul smelling urine x 1 week. Mild back pain and lower abdominal cramping. She states that she's had dark/brown vaginal bleeding for the past month. She denies known exposures to STDs, but states "there is always a possibility I guess."  No past medical history on file.  There are no active problems to display for this patient.   Past Surgical History  Procedure Laterality Date  . Cesarean section      Current Outpatient Rx  Name  Route  Sig  Dispense  Refill  . ciprofloxacin (CIPRO) 500 MG tablet   Oral   Take 1 tablet (500 mg total) by mouth 2 (two) times daily.   6 tablet   0   . ketorolac (TORADOL) 10 MG tablet   Oral   Take 1 tablet (10 mg total) by mouth every 6 (six) hours as needed.   20 tablet   0   . metroNIDAZOLE (FLAGYL) 500 MG tablet   Oral   Take 1 tablet (500 mg total) by mouth 2 (two) times daily.   14 tablet   0   . phenazopyridine (PYRIDIUM) 200 MG tablet   Oral   Take 1 tablet (200 mg total) by mouth 3 (three) times daily as needed for pain.   10 tablet   0   . potassium chloride (K-DUR) 10 MEQ tablet   Oral   Take 1 tablet (10 mEq total) by mouth daily.   5 tablet   0     Allergies Review of patient's allergies indicates no known allergies.  No family history on file.  Social History Social History  Substance Use Topics  . Smoking status: Never Smoker   . Smokeless tobacco: Not on file  . Alcohol Use: Yes    Review of Systems Constitutional: Negative for fever. Respiratory: Negative for shortness of breath or cough. Gastrointestinal: Positive for abdominal  pain; negative for nausea , negative for vomiting. Genitourinary: Positive for dysuria , positive for vaginal discharge. Musculoskeletal: Positive for back pain. Skin: Negative for lesion. ____________________________________________   PHYSICAL EXAM:  VITAL SIGNS: ED Triage Vitals  Enc Vitals Group     BP 05/14/15 1959 128/87 mmHg     Pulse Rate 05/14/15 1959 99     Resp 05/14/15 1959 18     Temp 05/14/15 1959 98.4 F (36.9 C)     Temp Source 05/14/15 1959 Oral     SpO2 05/14/15 1959 100 %     Weight 05/14/15 1959 170 lb (77.111 kg)     Height 05/14/15 1959  (1.676 m)     Head Cir --      Peak Flow --      Pain Score 05/14/15 1959 7     Pain Loc --      Pain Edu? --      Excl. in GC? --     Constitutional: Alert and oriented. Well appearing and in no acute distress. Eyes: Conjunctivae are normal. PERRL. EOMI. Head: Atraumatic. Nose: No congestion/rhinnorhea. Mouth/Throat: Mucous membranes are moist. Respiratory: Normal respiratory effort.  No retractions. Gastrointestinal: Abdomen soft and nontender without guarding or rebound Genitourinary: Pelvic exam: Dark brown discharge noted  in the vaginal vault and coating the cervix. There is no cervical motion tenderness on exam. There is no adnexal tenderness on exam. Musculoskeletal: No extremity tenderness nor edema.  Neurologic:  Normal speech and language. No gross focal neurologic deficits are appreciated. Speech is normal. No gait instability. Skin:  Skin is warm, dry and intact. No rash noted. Psychiatric: Mood and affect are normal. Speech and behavior are normal.  ____________________________________________   LABS (all labs ordered are listed, but only abnormal results are displayed)  Labs Reviewed  WET PREP, GENITAL - Abnormal; Notable for the following:    Clue Cells Wet Prep HPF POC PRESENT (*)    WBC, Wet Prep HPF POC FEW (*)    All other components within normal limits  COMPREHENSIVE METABOLIC PANEL -  Abnormal; Notable for the following:    Potassium 3.2 (*)    All other components within normal limits  URINALYSIS COMPLETEWITH MICROSCOPIC ([facility] ONLY) - Abnormal; Notable for the following:    Color, Urine YELLOW (*)    APPearance CLEAR (*)    Hgb urine dipstick 2+ (*)    Leukocytes, UA TRACE (*)    Bacteria, UA RARE (*)    Squamous Epithelial / LPF 0-5 (*)    All other components within normal limits  CHLAMYDIA/NGC RT PCR ([facility] ONLY)  CBC  POC URINE PREG, ED  POCT PREGNANCY, URINE   ____________________________________________  RADIOLOGY  Not indicated ____________________________________________   PROCEDURES  Procedure(s) performed: None  ____________________________________________   INITIAL IMPRESSION / ASSESSMENT AND PLAN / ED COURSE  Pertinent labs & imaging results that were available during my care of the patient were reviewed by me and considered in my medical decision making (see chart for details).  Patient will be given prescriptions for Flagyl and Cipro today. She was advised to follow up with gynecology for symptoms that are not improving over 7 days. She was advised to return to the ER for symptoms that change or worsen if unable to schedule an appointment. ____________________________________________   FINAL CLINICAL IMPRESSION(S) / ED DIAGNOSES  Final diagnoses:  Bacterial vaginosis  Acute cystitis without hematuria    Note:  This document was prepared using Dragon voice recognition software and may include unintentional dictation errors.   Chinita PesterCari B Arna Luis, FNP 05/14/15 [id number]  Loleta Roseory Forbach, MD 05/15/15 [phone]

## 2015-05-14 NOTE — Discharge Instructions (Signed)

## 2015-05-15 LAB — CHLAMYDIA/NGC RT PCR (ARMC ONLY)
Chlamydia Tr: NOT DETECTED
N gonorrhoeae: NOT DETECTED

## 2017-01-28 LAB — HM PAP SMEAR: HM Pap smear: NEGATIVE

## 2017-01-28 LAB — HM HIV SCREENING LAB: HM HIV Screening: NEGATIVE

## 2017-10-18 ENCOUNTER — Encounter: Payer: Self-pay | Admitting: Intensive Care

## 2017-10-18 ENCOUNTER — Other Ambulatory Visit: Payer: Self-pay

## 2017-10-18 ENCOUNTER — Emergency Department: Payer: Self-pay

## 2017-10-18 ENCOUNTER — Emergency Department
Admission: EM | Admit: 2017-10-18 | Discharge: 2017-10-18 | Disposition: A | Discharge status: Home / Self Care | Payer: Self-pay | Attending: Emergency Medicine | Admitting: Emergency Medicine

## 2017-10-18 DIAGNOSIS — Z79899 Other long term (current) drug therapy: Secondary | ICD-10-CM | POA: Insufficient documentation

## 2017-10-18 DIAGNOSIS — J9801 Acute bronchospasm: Secondary | ICD-10-CM | POA: Insufficient documentation

## 2017-10-18 MED ORDER — GUAIFENESIN-CODEINE 100-10 MG/5ML PO SYRP: 5.0000 mL | ORAL_SOLUTION | Freq: Every day | ORAL | 0 refills | Status: DC

## 2017-10-18 MED ORDER — IPRATROPIUM-ALBUTEROL 0.5-2.5 (3) MG/3ML IN SOLN
3.0000 mL | Freq: Once | RESPIRATORY_TRACT | Status: AC
  Administered 2017-10-18: 3 mL via RESPIRATORY_TRACT
  Filled 2017-10-18: qty 3

## 2017-10-18 MED ORDER — BENZONATATE 100 MG PO CAPS: 200.0000 mg | ORAL_CAPSULE | Freq: Three times a day (TID) | ORAL | 0 refills | Status: AC | PRN

## 2017-10-18 NOTE — ED Provider Notes (Signed)
[facility] Emergency Department Provider Note   ____________________________________________   First MD Initiated Contact with Patient 10/18/17 1122     (approximate)  I have reviewed the triage vital signs and the nursing notes.   HISTORY  Chief Complaint Cough    HPI Heather Barrett is a 32 y.o. female patient presents with nonproductive cough for 2 weeks.  Patient states cough is worse at night.  Patient state no relief or using her daughter's nebulizer machine at home.  Patient denies fever chills associated this complaint.  Patient cough increases with talking and deep inspirations.  Patient has no history of asthma.  History reviewed. No pertinent past medical history.  There are no active problems to display for this patient.   Past Surgical History:  Procedure Laterality Date  . CESAREAN SECTION      Prior to Admission medications   Medication Sig Start Date End Date Taking? Authorizing Provider  benzonatate (TESSALON PERLES) 100 MG capsule Take 2 capsules (200 mg total) by mouth 3 (three) times daily as needed. 10/18/17 10/18/18  Joni Reining, PA-C  ciprofloxacin (CIPRO) 500 MG tablet Take 1 tablet (500 mg total) by mouth 2 (two) times daily. 05/14/15   Triplett, Cari B, FNP  guaiFENesin-codeine (CHERATUSSIN AC) 100-10 MG/5ML syrup Take 5 mLs by mouth at bedtime. 10/18/17   Joni Reining, PA-C  ketorolac (TORADOL) 10 MG tablet Take 1 tablet (10 mg total) by mouth every 6 (six) hours as needed. 07/04/14   Ruffian, III Kristine Garbe, PA-C  metroNIDAZOLE (FLAGYL) 500 MG tablet Take 1 tablet (500 mg total) by mouth 2 (two) times daily. 05/14/15   Triplett, Rulon Eisenmenger B, FNP  phenazopyridine (PYRIDIUM) 200 MG tablet Take 1 tablet (200 mg total) by mouth 3 (three) times daily as needed for pain. 07/04/14   Ruffian, III Kristine Garbe, PA-C  potassium chloride (K-DUR) 10 MEQ tablet Take 1 tablet (10 mEq total) by mouth daily. 07/04/14   Garrel Ridgel, PA-C     Allergies Patient has no known allergies.  History reviewed. No pertinent family history.  Social History Social History   Tobacco Use  . Smoking status: Never Smoker  . Smokeless tobacco: Never Used  Substance Use Topics  . Alcohol use: Yes    Comment: occ  . Drug use: No    Review of Systems Constitutional: No fever/chills Eyes: No visual changes. ENT: No sore throat. Cardiovascular: Denies chest pain. Respiratory: Denies shortness of breath.  Nonproductive cough. Gastrointestinal: No abdominal pain.  No nausea, no vomiting.  No diarrhea.  No constipation. Genitourinary: Negative for dysuria. Musculoskeletal: Negative for back pain. Skin: Negative for rash. Neurological: Negative for headaches, focal weakness or numbness.   ____________________________________________   PHYSICAL EXAM:  VITAL SIGNS: ED Triage Vitals  Enc Vitals Group     BP 10/18/17 1028 (!) 139/99     Pulse Rate 10/18/17 1028 83     Resp 10/18/17 1028 18     Temp 10/18/17 1028 98.7 F (37.1 C)     Temp Source 10/18/17 1028 Oral     SpO2 10/18/17 1028 100 %     Weight 10/18/17 1038 190 lb (86.2 kg)     Height 10/18/17 1038 5\' 6"  (1.676 m)     Head Circumference --      Peak Flow --      Pain Score 10/18/17 1038 0     Pain Loc --      Pain Edu? --  Excl. in GC? --    Constitutional: Alert and oriented. Well appearing and in no acute distress. Neck: No stridor.  Cardiovascular: Normal rate, regular rhythm. Grossly normal heart sounds.  Good peripheral circulation. Respiratory: Normal respiratory effort.  No retractions. Lungs CTAB.  Cough with deep inspirations. Neurologic:  Normal speech and language. No gross focal neurologic deficits are appreciated. No gait instability. Skin:  Skin is warm, dry and intact. No rash noted. Psychiatric: Mood and affect are normal. Speech and behavior are normal.  ____________________________________________   LABS (all labs ordered are  listed, but only abnormal results are displayed)  Labs Reviewed - No data to display ____________________________________________  EKG   ____________________________________________  RADIOLOGY  ED MD interpretation:    Official radiology report(s): Dg Chest 2 View  Result Date: 10/18/2017 CLINICAL DATA:  Dry cough for 2 weeks EXAM: CHEST - 2 VIEW COMPARISON:  None. FINDINGS: The heart size and mediastinal contours are within normal limits. Both lungs are clear. The visualized skeletal structures are unremarkable. IMPRESSION: No active cardiopulmonary disease. Electronically Signed   By: Alcide Clever M.D.   On: 10/18/2017 11:13    ____________________________________________   PROCEDURES  Procedure(s) performed: None  Procedures  Critical Care performed: No  ____________________________________________   INITIAL IMPRESSION / ASSESSMENT AND PLAN / ED COURSE  As part of my medical decision making, I reviewed the following data within the electronic MEDICAL RECORD NUMBER    Cough secondary to bronchospasm.  Discussed negative checks x-ray  with patient.  Patient given discharge care instruction advised take medication as directed.  Patient [facility].      ____________________________________________   FINAL CLINICAL IMPRESSION(S) / ED DIAGNOSES  Final diagnoses:  Cough due to bronchospasm     ED Discharge Orders         Ordered    benzonatate (TESSALON PERLES) 100 MG capsule  3 times daily PRN     10/18/17 1148    guaiFENesin-codeine (CHERATUSSIN AC) 100-10 MG/5ML syrup  Daily at bedtime     10/18/17 1148           Note:  This document was prepared using Dragon voice recognition software and may include unintentional dictation errors.    Joni Reining, PA-C 10/18/17 1200    Governor Rooks, MD 10/18/17 [phone]

## 2017-10-18 NOTE — Discharge Instructions (Signed)
Take medications as directed

## 2017-10-18 NOTE — ED Triage Notes (Signed)
Patient c/o dry cough X 2 weeks. Reports trying some of her daughters neb treatments at home with no relief. Worse at night

## 2018-10-04 ENCOUNTER — Other Ambulatory Visit: Payer: Self-pay

## 2018-10-04 DIAGNOSIS — Z20822 Contact with and (suspected) exposure to covid-19: Secondary | ICD-10-CM

## 2018-10-05 LAB — NOVEL CORONAVIRUS, NAA: SARS-CoV-2, NAA: NOT DETECTED

## 2018-10-17 ENCOUNTER — Other Ambulatory Visit: Payer: Self-pay

## 2018-10-17 DIAGNOSIS — Z20822 Contact with and (suspected) exposure to covid-19: Secondary | ICD-10-CM

## 2018-10-18 LAB — NOVEL CORONAVIRUS, NAA: SARS-CoV-2, NAA: NOT DETECTED

## 2018-10-27 ENCOUNTER — Other Ambulatory Visit: Payer: Self-pay

## 2018-10-27 DIAGNOSIS — Z20822 Contact with and (suspected) exposure to covid-19: Secondary | ICD-10-CM

## 2018-10-29 LAB — NOVEL CORONAVIRUS, NAA: SARS-CoV-2, NAA: NOT DETECTED

## 2018-11-07 ENCOUNTER — Other Ambulatory Visit: Payer: Self-pay

## 2018-11-07 DIAGNOSIS — Z20822 Contact with and (suspected) exposure to covid-19: Secondary | ICD-10-CM

## 2018-11-08 LAB — NOVEL CORONAVIRUS, NAA: SARS-CoV-2, NAA: NOT DETECTED

## 2018-12-20 ENCOUNTER — Ambulatory Visit: Payer: Medicaid Other | Admitting: Podiatry

## 2018-12-20 ENCOUNTER — Other Ambulatory Visit: Payer: Self-pay

## 2019-01-01 ENCOUNTER — Encounter: Payer: Self-pay | Admitting: Emergency Medicine

## 2019-01-01 ENCOUNTER — Emergency Department
Admission: EM | Admit: 2019-01-01 | Discharge: 2019-01-01 | Discharge status: Against Medical Advice | Payer: Self-pay | Attending: Student | Admitting: Student

## 2019-01-01 DIAGNOSIS — M79673 Pain in unspecified foot: Secondary | ICD-10-CM | POA: Insufficient documentation

## 2019-01-01 DIAGNOSIS — Z79899 Other long term (current) drug therapy: Secondary | ICD-10-CM | POA: Insufficient documentation

## 2019-01-01 DIAGNOSIS — Z5320 Procedure and treatment not carried out because of patient's decision for unspecified reasons: Secondary | ICD-10-CM | POA: Insufficient documentation

## 2019-01-01 NOTE — ED Notes (Addendum)
This RN went to discharge pt, pt not present in room or lobby, or bathrooms. Will leave paperwork at front desk for pt. Pt told Melody NT that she wasn't going to wait for paperwork.

## 2019-01-01 NOTE — ED Notes (Addendum)
Patient re-presents self.  Still wishes to be seen.

## 2019-01-01 NOTE — ED Provider Notes (Signed)
Emergency Department Provider Note  ____________________________________________  Time seen: Approximately 8:10 PM  I have reviewed the triage vital signs and the nursing notes.   HISTORY  Chief Complaint Foot Pain   Historian Patient    HPI Heather Barrett is a 33 y.o. female presents to the emergency department for a wound check.  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  Patient states that she used salicylic acid patches along the plantar aspect of her feet for treatments.  Patient states that after patches were removed, she noticed some green discoloration of the skin.  Patient stated that patches were brown in color.  Patient noticed that the skin around the plantar wart has become hard and seemingly callus and seems to be peeling.  She is concerned as the affected areas seem more tender.   History reviewed. No pertinent past medical history.   Immunizations up to date:  Yes.     History reviewed. No pertinent past medical history.  There are no problems to display for this patient.   Past Surgical History:  Procedure Laterality Date  . CESAREAN SECTION      Prior to Admission medications   Medication Sig Start Date End Date Taking? Authorizing Provider  ciprofloxacin (CIPRO) 500 MG tablet Take 1 tablet (500 mg total) by mouth 2 (two) times daily. 05/14/15   Triplett, Cari B, FNP  guaiFENesin-codeine (CHERATUSSIN AC) 100-10 MG/5ML syrup Take 5 mLs by mouth at bedtime. 10/18/17   Joni Reining, PA-C  ketorolac (TORADOL) 10 MG tablet Take 1 tablet (10 mg total) by mouth every 6 (six) hours as needed. 07/04/14   Ruffian, III Kristine Garbe, PA-C  metroNIDAZOLE (FLAGYL) 500 MG tablet Take 1 tablet (500 mg total) by mouth 2 (two) times daily. 05/14/15   Triplett, Rulon Eisenmenger B, FNP  phenazopyridine (PYRIDIUM) 200 MG tablet Take 1 tablet (200 mg total) by mouth 3 (three) times daily as needed for pain. 07/04/14   Ruffian, III Kristine Garbe, PA-C  potassium chloride (K-DUR) 10 MEQ tablet Take 1 tablet (10 mEq total) by mouth daily. 07/04/14   Garrel Ridgel, PA-C    Allergies Patient has no known allergies.  No family history on file.  Social History Social History   Tobacco Use  . Smoking status: Never Smoker  . Smokeless tobacco: Never Used  Substance Use Topics  . Alcohol use: Yes    Comment: occ  . Drug use: No     Review of Systems  Constitutional: No fever/chills Eyes:  No discharge ENT: No upper respiratory complaints. Respiratory: no cough. No SOB/ use of accessory muscles to breath Gastrointestinal:   No nausea, no vomiting.  No diarrhea.  No constipation. Musculoskeletal: Negative for musculoskeletal pain. Skin: Patient has plantar warts.    ____________________________________________   PHYSICAL EXAM:  VITAL SIGNS: ED Triage Vitals  Enc Vitals Group     BP 01/01/19 1613 122/84     Pulse Rate 01/01/19 1613 83     Resp 01/01/19 1613 20     Temp 01/01/19 1613 98.5 F (36.9 C)     Temp Source 01/01/19 1613 Oral     SpO2 01/01/19 1613 97 %     Weight 01/01/19 1556 190 lb 0.6 oz (86.2 kg)     Height --      Head Circumference --      Peak Flow --      Pain Score 01/01/19 1556 5  Pain Loc --      Pain Edu? --      Excl. in [place]? --      Constitutional: Alert and oriented. Well appearing and in no acute distress. Eyes: Conjunctivae are normal. PERRL. EOMI. Head: Atraumatic. Cardiovascular: Normal rate, regular rhythm. Normal S1 and S2.  Good peripheral circulation. Respiratory: Normal respiratory effort without tachypnea or retractions. Lungs CTAB. Good air entry to the bases with no decreased or absent breath sounds Musculoskeletal: Full range of motion to all extremities. No obvious deformities noted Neurologic:  Normal for age. No gross focal neurologic deficits are appreciated.  Skin:  Skin is warm, dry and intact. No rash noted. Psychiatric: Mood and affect are normal  for age. Speech and behavior are normal.   ____________________________________________   LABS (all labs ordered are listed, but only abnormal results are displayed)  Labs Reviewed - No data to display ____________________________________________  EKG   ____________________________________________  RADIOLOGY   No results found.  ____________________________________________    PROCEDURES  Procedure(s) performed:     Procedures     Medications - No data to display   ____________________________________________   INITIAL IMPRESSION / ASSESSMENT AND PLAN / ED COURSE  Pertinent labs & imaging results that were available during my care of the patient were reviewed by me and considered in my medical decision making (see chart for details).      Assessment and Plan:  Plantar warts 33 year old female presents to the emergency department for a wound check of her plantar warts.  On physical exam, patient did have green color changes secondary to brown paper salicylic acid transfer.  I explained to patient that skin color changes with return to normal after skin was exfoliated secondary to salicylic acid application.  I advised patient that callused skin was secondary to acid application and desquamation was a desired outcome.  Patient seemed frustrated by lack of available treatment to resolve her plantar warts.  I did give her a follow-up referral to podiatry.  Patient left before her paperwork was administered.    ____________________________________________  FINAL CLINICAL IMPRESSION(S) / ED DIAGNOSES  Final diagnoses:  Pain of foot, unspecified laterality      NEW MEDICATIONS STARTED DURING THIS VISIT:  ED Discharge Orders    None          This chart was dictated using voice recognition software/Dragon. Despite best efforts to proofread, errors can occur which can change the meaning. Any change was purely unintentional.     Lannie Fields,  PA-C 01/01/19 2017    Lilia Pro., MD 01/01/19 2051

## 2019-01-01 NOTE — ED Triage Notes (Signed)
C/o plantar warts to feet that are "turning green".

## 2019-03-03 ENCOUNTER — Telehealth: Payer: Self-pay

## 2019-03-03 NOTE — Telephone Encounter (Signed)
Referral came in from [facility] for sickle cell. Called referring office and also called patient to informed them that we do not see sickle cell patients.

## 2019-07-01 ENCOUNTER — Emergency Department
Admission: EM | Admit: 2019-07-01 | Discharge: 2019-07-01 | Discharge status: Against Medical Advice | Payer: Medicaid Other

## 2019-07-01 NOTE — ED Notes (Signed)
No answer when called for triage 

## 2019-07-01 NOTE — ED Notes (Signed)
No answer when called for triage x 3.  

## 2019-07-02 ENCOUNTER — Ambulatory Visit
Admission: EM | Admit: 2019-07-02 | Discharge: 2019-07-02 | Disposition: A | Discharge status: Home / Self Care | Payer: Self-pay | Attending: Physician Assistant | Admitting: Physician Assistant

## 2019-07-02 ENCOUNTER — Other Ambulatory Visit: Payer: Self-pay

## 2019-07-02 ENCOUNTER — Ambulatory Visit (INDEPENDENT_AMBULATORY_CARE_PROVIDER_SITE_OTHER): Payer: Self-pay

## 2019-07-02 DIAGNOSIS — S92514A Nondisplaced fracture of proximal phalanx of right lesser toe(s), initial encounter for closed fracture: Secondary | ICD-10-CM

## 2019-07-02 DIAGNOSIS — M79671 Pain in right foot: Secondary | ICD-10-CM

## 2019-07-02 DIAGNOSIS — W2209XA Striking against other stationary object, initial encounter: Secondary | ICD-10-CM

## 2019-07-02 NOTE — ED Provider Notes (Signed)
[facility]    CSN: [id number] Arrival date & time: 07/02/19  1026      History   Chief Complaint Chief Complaint  Patient presents with  . Foot Injury    right third toe    HPI Heather Barrett is a 34 y.o. female.   34 year old female comes in with right toe pain after injury 3 days ago. Hit middle toe with hover board. Has been able to ambulate, now with increased pain due to standing/walking. No numbness/tingling.      History reviewed. No pertinent past medical history.  There are no problems to display for this patient.   Past Surgical History:  Procedure Laterality Date  . CESAREAN SECTION      OB History   No obstetric history on file.      Home Medications    Prior to Admission medications   Medication Sig Start Date End Date Taking? Authorizing Provider  potassium chloride (K-DUR) 10 MEQ tablet Take 1 tablet (10 mEq total) by mouth daily. 07/04/14 07/02/19  Carlena Hurl, PA-C    Family History Family History  Problem Relation Age of Onset  . Healthy Mother   . Healthy Father     Social History Social History   Tobacco Use  . Smoking status: Never Smoker  . Smokeless tobacco: Never Used  Substance Use Topics  . Alcohol use: Not Currently    Comment: occ  . Drug use: No     Allergies   Patient has no known allergies.   Review of Systems Review of Systems  Reason unable to perform ROS: See HPI as above.     Physical Exam Triage Vital Signs ED Triage Vitals  Enc Vitals Group     BP 07/02/19 1105 (!) 131/94     Pulse Rate 07/02/19 1105 79     Resp 07/02/19 1105 14     Temp 07/02/19 1105 98.2 F (36.8 C)     Temp Source 07/02/19 1105 Oral     SpO2 07/02/19 1105 97 %     Weight --      Height --      Head Circumference --      Peak Flow --      Pain Score 07/02/19 1103 6     Pain Loc --      Pain Edu? --      Excl. in [place]? --    No data found.  Updated Vital Signs BP (!) 131/94 (BP Location: Right  Arm)   Pulse 79   Temp 98.2 F (36.8 C) (Oral)   Resp 14   LMP 06/25/2019 (Approximate)   SpO2 97%   Physical Exam Constitutional:      General: She is not in acute distress.    Appearance: Normal appearance. She is well-developed. She is not toxic-appearing or diaphoretic.  HENT:     Head: Normocephalic and atraumatic.  Eyes:     Conjunctiva/sclera: Conjunctivae normal.     Pupils: Pupils are equal, round, and reactive to light.  Pulmonary:     Effort: Pulmonary effort is normal. No respiratory distress.     Comments: Speaking in full sentences without difficulty Musculoskeletal:     Cervical back: Normal range of motion and neck supple.     Comments: Swelling to right 3rd toe without erythema, contusion. Tenderness to palpation of proximal 3rd toe. No tenderness to palpation of MTPs, other toes. Sensation intact, pedal pulse 2+, cap refill <2s  Skin:    General: Skin is warm and dry.  Neurological:     Mental Status: She is alert and oriented to person, place, and time.      UC Treatments / Results  Labs (all labs ordered are listed, but only abnormal results are displayed) Labs Reviewed - No data to display  EKG   Radiology DG Foot Complete Right  Result Date: 07/02/2019 CLINICAL DATA:  Hit foot on hover board 2 days ago with persistent pain. EXAM: RIGHT FOOT COMPLETE - 3+ VIEW COMPARISON:  None. FINDINGS: There is an acute obliquely oriented nondisplaced fracture involving the midshaft of the proximal phalanx of the third digit. No definite intra-articular extension. There is mild diffuse soft tissue swelling about the forefoot. No radiopaque foreign body. Joint spaces are preserved. No significant hallux valgus deformity. No significant plantar calcaneal spur. IMPRESSION: Acute, obliquely oriented, nondisplaced fracture involving the mid shaft of the proximal phalanx of the third digit without definitive intra-articular extension or radiopaque foreign body.  Electronically Signed   By: Simonne Come M.D.   On: 07/02/2019 11:40    Procedures Procedures (including critical care time)  Medications Ordered in UC Medications - No data to display  Initial Impression / Assessment and Plan / UC Course  I have reviewed the triage vital signs and the nursing notes.  Pertinent labs & imaging results that were available during my care of the patient were reviewed by me and considered in my medical decision making (see chart for details).    Discussed x-ray results with patient.  Patient currently neurovascularly intact.  Will start postop boot, ice compress, medications for pain management.  Discussed typically take 6 to 8 weeks to heal, to monitor symptoms.  Follow-up with PCP/orthopedics if needing restrictions/pain control.  Return precautions given.  If pain not controlled with ibuprofen/Tylenol, can fill tramadol 50 mg every 8 hours as needed, D#15, R#0  Final Clinical Impressions(s) / UC Diagnoses   Final diagnoses:  Closed nondisplaced fracture of proximal phalanx of lesser toe of right foot, initial encounter   ED Prescriptions    None     I have reviewed the PDMP during this encounter.   Belinda Fisher, PA-C 07/02/19 1207

## 2019-07-02 NOTE — Discharge Instructions (Addendum)
Post op boot as directed. Ice compress, rest. Ibuprofen 800mg  three times a day, tylenol 1000mg  three times a day. If pain not  controlled, please contact office. This typically takes 6-8 weeks to completely resolve, follow up with orthopedics or PCP if needing restrictions/pain control. Monitor for any discoloration, numbness to the toe.

## 2019-07-02 NOTE — ED Triage Notes (Signed)
Patient presents with right foot pain (3rd digit).  She states she hit her foot on Friday.

## 2019-09-15 ENCOUNTER — Encounter: Payer: Self-pay | Admitting: Physician Assistant

## 2019-09-15 ENCOUNTER — Ambulatory Visit (LOCAL_COMMUNITY_HEALTH_CENTER): Payer: Medicaid Other | Admitting: Physician Assistant

## 2019-09-15 ENCOUNTER — Other Ambulatory Visit: Payer: Self-pay

## 2019-09-15 VITALS — BP 117/82 | Ht 66.0 in | Wt 177.6 lb

## 2019-09-15 DIAGNOSIS — Z0000 Encounter for general adult medical examination without abnormal findings: Secondary | ICD-10-CM | POA: Diagnosis not present

## 2019-09-15 DIAGNOSIS — Z3046 Encounter for surveillance of implantable subdermal contraceptive: Secondary | ICD-10-CM

## 2019-09-15 DIAGNOSIS — Z3009 Encounter for other general counseling and advice on contraception: Secondary | ICD-10-CM

## 2019-09-15 DIAGNOSIS — Z299 Encounter for prophylactic measures, unspecified: Secondary | ICD-10-CM | POA: Diagnosis not present

## 2019-09-15 DIAGNOSIS — Z113 Encounter for screening for infections with a predominantly sexual mode of transmission: Secondary | ICD-10-CM

## 2019-09-15 LAB — WET PREP FOR TRICH, YEAST, CLUE
Trichomonas Exam: NEGATIVE
Yeast Exam: NEGATIVE

## 2019-09-15 MED ORDER — METRONIDAZOLE 500 MG PO TABS: 500.0000 mg | ORAL_TABLET | Freq: Two times a day (BID) | ORAL | 0 refills | Status: AC

## 2019-09-15 NOTE — Progress Notes (Signed)
Here today for PE and to discuss concerns with vaginal. Last PE and Pap Smear here was 01/28/2017. Nexplanon Inserted here 02/03/2017. Wants all STD screening today. Tawny Hopping, RN

## 2019-09-16 NOTE — Progress Notes (Signed)
Family Planning Visit- Repeat Yearly Visit  Subjective:  Heather Barrett is a 34 y.o. [id number]  being seen today for an well woman visit and to discuss family planning options.    She is currently using Nexplanon for pregnancy prevention. Patient reports she does not want a pregnancy in the next year. Patient  does not have a problem list on file.  Chief Complaint  Patient presents with  . Contraception    annual exam    Patient reports that she is doing well with the Nexplanon but she has been having some irregular bleeding since about August 10, that has been heavy most days and some spotting some days with cramping.  Requests screening for infections today.  Reports that she had a tummy tuck surgery in January of this year.  Per chart review, CBE due 2022 and pap due 2024.  Patient denies any other concerns today.    See flowsheet for other program required questions.   Body mass index is 28.67 kg/m. - Patient is eligible for diabetes screening based on BMI and age >7?  not applicable HA1C ordered? not applicable  Patient reports 2 partners in last year. Desires STI screening?  Yes   Has patient been screened once for HCV in the past?  No  No results found for: HCVAB  Does the patient have current of drug use, have a partner with drug use, and/or has been incarcerated since last result? No  If yes-- Screen for HCV through [facility]   Does the patient meet criteria for HBV testing? No  Criteria:  -Household, sexual or needle sharing contact with HBV -History of drug use -HIV positive -Those with known Hep C   Health Maintenance Due  Topic Date Due  . Hepatitis C Screening  Never done  . COVID-19 Vaccine (1) Never done  . INFLUENZA VACCINE  08/06/2019    Review of Systems  All other systems reviewed and are negative.   The following portions of the patient's history were reviewed and updated as appropriate: allergies, current medications, past family history,  past medical history, past social history, past surgical history and problem list. Problem list updated.  Objective:   Vitals:   09/15/19 0857  BP: 117/82  Weight: 177 lb 9.6 oz (80.6 kg)  Height: 5\' 6"  (1.676 m)    Physical Exam Vitals and nursing note reviewed.  Constitutional:      General: She is not in acute distress.    Appearance: Normal appearance.  HENT:     Head: Normocephalic and atraumatic.  Eyes:     Conjunctiva/sclera: Conjunctivae normal.  Neck:     Thyroid: No thyroid mass, thyromegaly or thyroid tenderness.  Cardiovascular:     Rate and Rhythm: Normal rate and regular rhythm.  Pulmonary:     Effort: Pulmonary effort is normal.     Breath sounds: Normal breath sounds.  Abdominal:     Palpations: Abdomen is soft. There is no mass.     Tenderness: There is no abdominal tenderness. There is no guarding or rebound.  Genitourinary:    General: Normal vulva.     Rectum: Normal.     Comments: External genitalia/pubic area without nits, lice, edema, erythema, lesions and inguinal adenopathy. Vagina with normal mucosa, small amount of menstrual bleeding and odor. Cervix without visible lesions. Uterus firm, mobile, nt, no masses, no CMT, no adnexal tenderness or fullness. Musculoskeletal:     Cervical back: Neck supple. No tenderness.  Lymphadenopathy:  Cervical: No cervical adenopathy.  Skin:    General: Skin is warm and dry.     Findings: No bruising, erythema, lesion or rash.  Neurological:     Mental Status: She is alert and oriented to person, place, and time.  Psychiatric:        Mood and Affect: Mood normal.        Behavior: Behavior normal.        Thought Content: Thought content normal.        Judgment: Judgment normal.       Assessment and Plan:  Heather Barrett is a 34 y.o. female [phone] presenting to the [facility] Department for an yearly well woman exam/family planning visit  Contraception counseling: Reviewed all forms of  birth control options in the tiered based approach. available including abstinence; over the counter/barrier methods; hormonal contraceptive medication including pill, patch, ring, injection,contraceptive implant, ECP; hormonal and nonhormonal IUDs; permanent sterilization options including vasectomy and the various tubal sterilization modalities. Risks, benefits, and typical effectiveness rates were reviewed.  Questions were answered.  Written information was also given to the patient to review.  Patient desires to continue with Nexplanon. She will follow up in  1 year and prn for surveillance.  She was told to call with any further questions, or with any concerns about this method of contraception.  Emphasized use of condoms 100% of the time for STI prevention.  Patient was not a candidate for ECP today.   1. Encounter for counseling regarding contraception Reviewed normal SE of Nexplanon with patient and that irregular bleeding can happen at any time. Rec condoms with all sex for STD protection.  2. Screening for STD (sexually transmitted disease) Await test results.  Counseled that RN will call if needs to RTC for further treatment once results are back.  - WET PREP FOR TRICH, YEAST, CLUE - HIV/HCV [place] [facility] - Syphilis Serology, [place] [facility] - Chlamydia/Gonorrhea [place] [facility]  3. Well woman exam (no gynecological exam) Reviewed healthy habits for general health. Enc MVI 1 po daily. Enc to establish with /follow up with PCP for primary care concerns and illness.  4. Encounter for surveillance of implantable subdermal contraceptive Nexplanon palpated in place and is without edema, erythema or tenderness.  5. Prophylactic measure Due to vaginal odor and patient with irregular bleeding for 1 month will cover for BV with Metronidazole 500 mg #14 1 po BID for 7 days with food, no EtOH for 24 hr before and until 72 hr after completing medicine. Reviewed SE of medicines and when to d/c and  call clinic, Enc to use OTC antifungal cream if has itching during or just after treatment with antibiotic. - metroNIDAZOLE (FLAGYL) 500 MG tablet; Take 1 tablet (500 mg total) by mouth 2 (two) times daily for 7 days.  Dispense: 14 tablet; Refill: 0     Return in about 1 year (around 09/14/2020) for RP and prn.  No future appointments.  Matt Holmes, PA

## 2019-09-16 NOTE — Progress Notes (Signed)
Chart reviewed by Pharmacist  Suzanne Walker PharmD, Contract Pharmacist at [facility]  

## 2019-09-17 IMAGING — CR DG CHEST 2V
2 series · 2 of 2 positions shown · non-contrast
Comparison: None.

CLINICAL DATA: Dry cough for 2 weeks

EXAM:
CHEST - 2 VIEW

[chest pa]
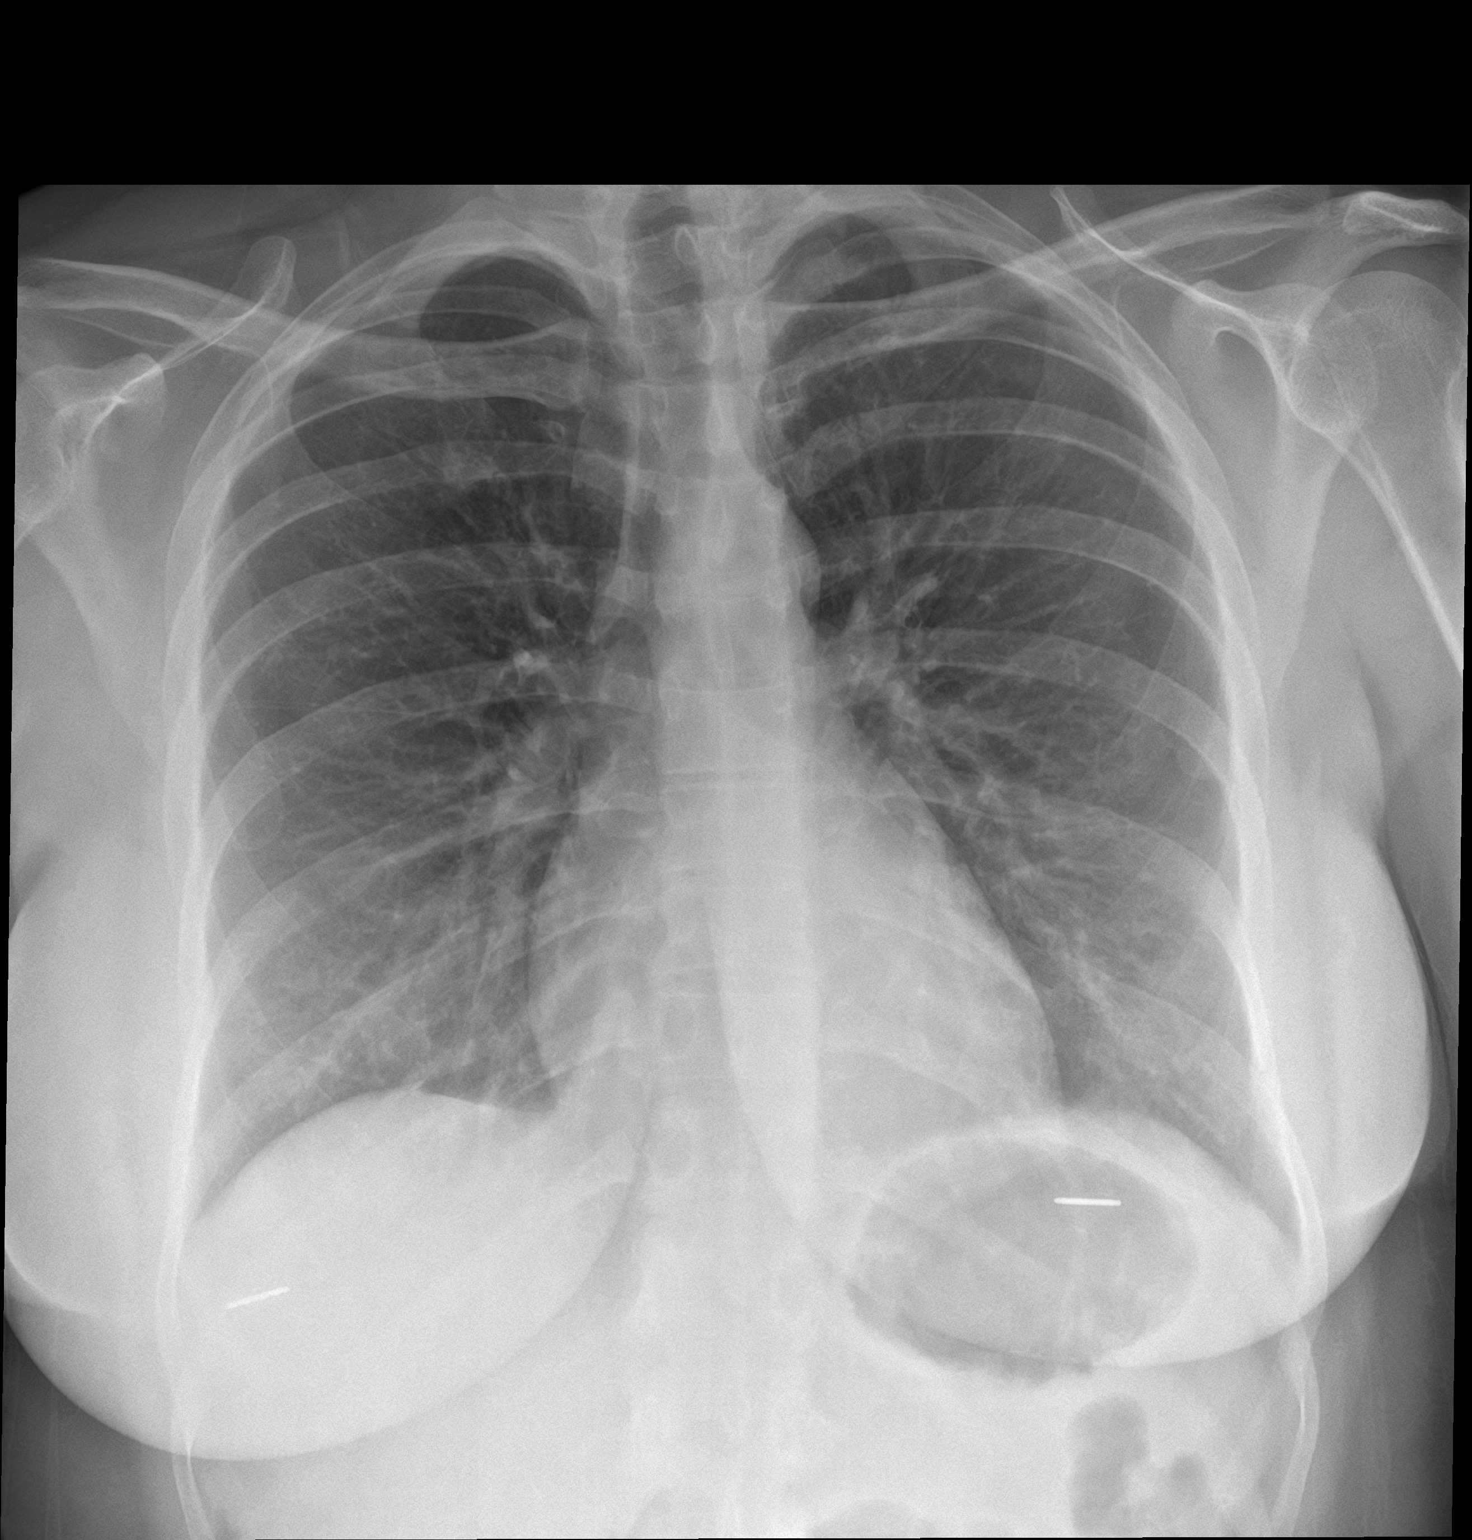

[chest lat]
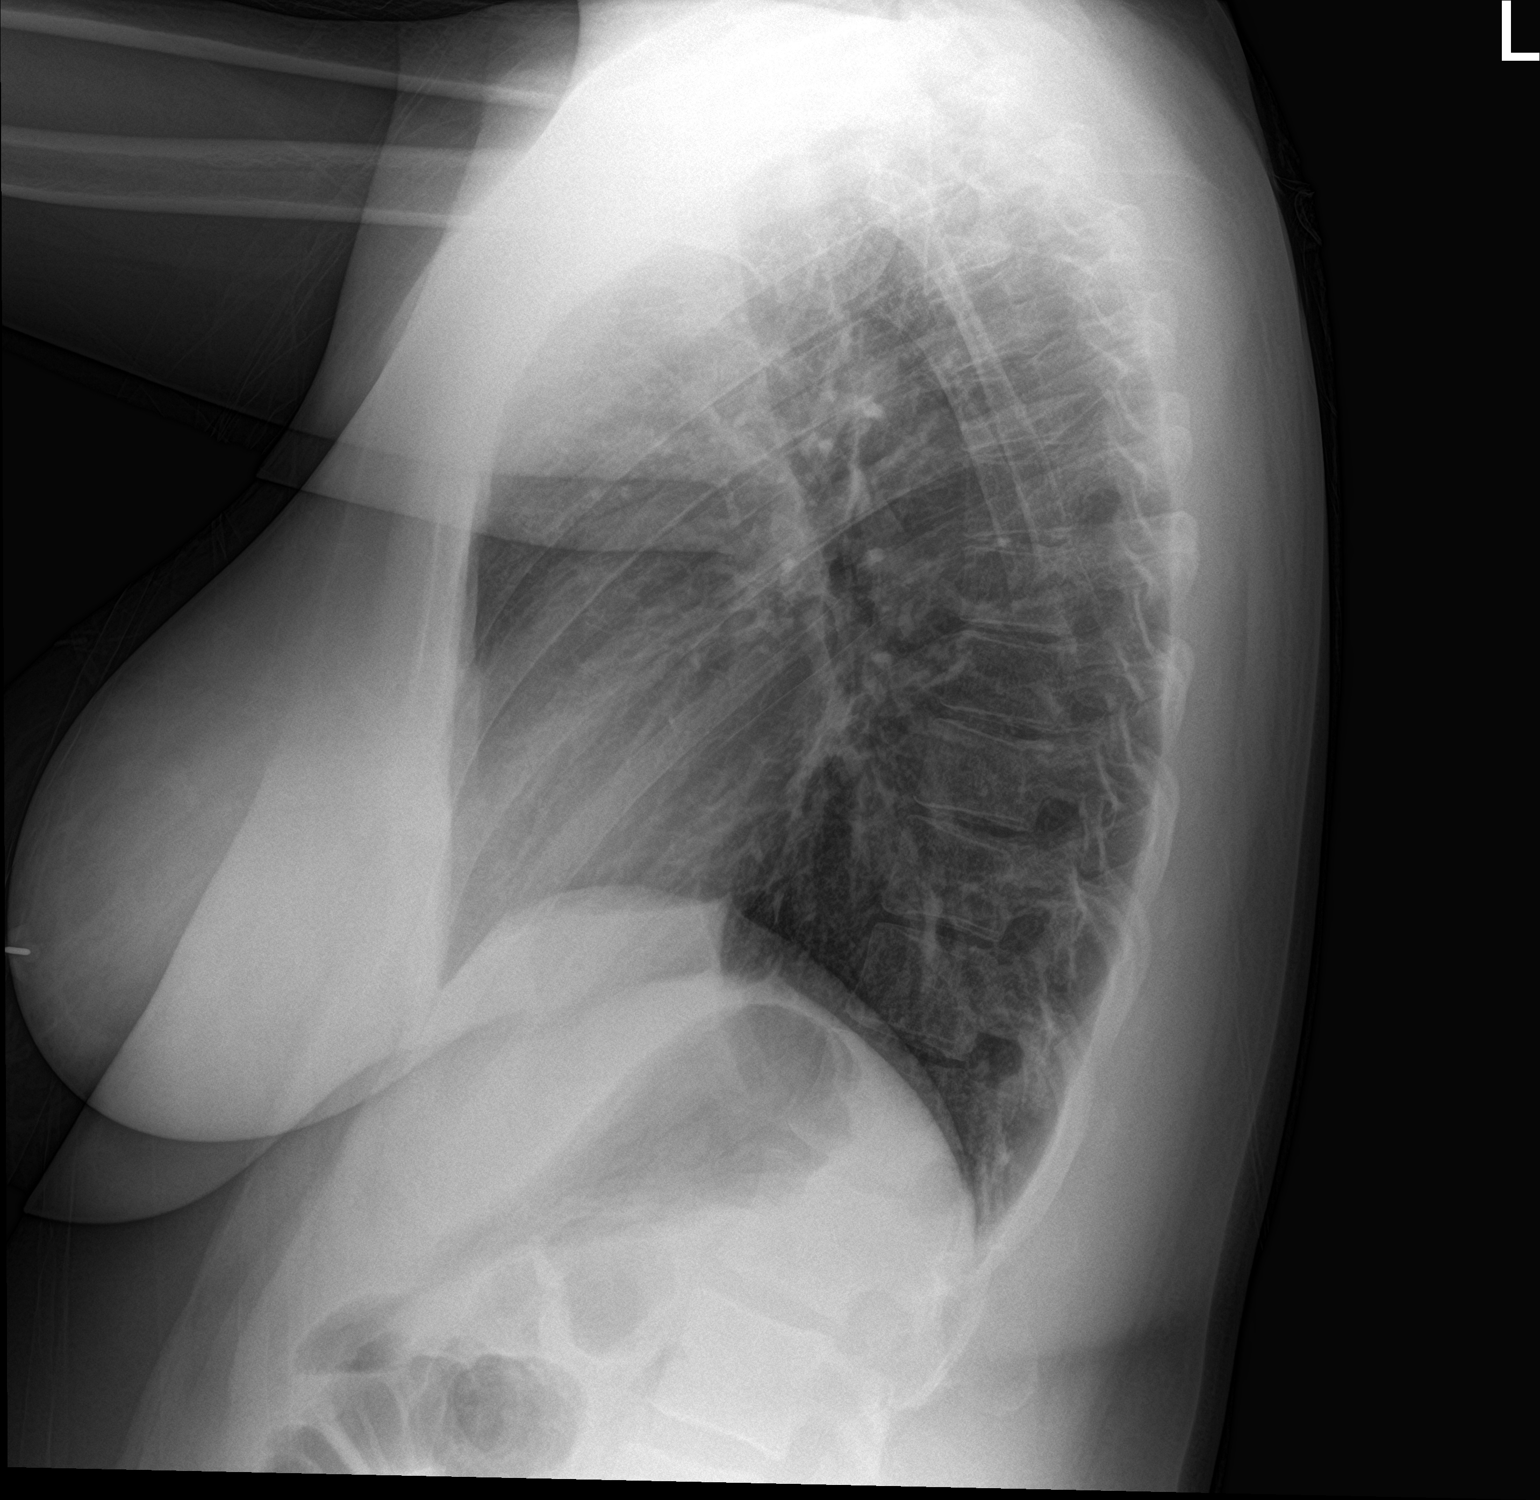

[2 of 2 positions shown; findings below may reference images not displayed]

FINDINGS: The heart size and mediastinal contours are within normal limits.
Both lungs are clear. The visualized skeletal structures are
unremarkable.
IMPRESSION: No active cardiopulmonary disease.

## 2019-09-21 LAB — HM HEPATITIS C SCREENING LAB: HM Hepatitis Screen: NEGATIVE

## 2019-09-21 LAB — HM HIV SCREENING LAB: HM HIV Screening: NEGATIVE

## 2019-09-25 ENCOUNTER — Encounter: Payer: Self-pay | Admitting: Family Medicine

## 2020-02-07 ENCOUNTER — Telehealth: Payer: Self-pay | Admitting: Family Medicine

## 2020-02-07 NOTE — Telephone Encounter (Signed)
Please call me I need to know When my Nex was placed and if Im at my removal time

## 2020-02-07 NOTE — Telephone Encounter (Signed)
Per [facility] records: Nexplanon removal and reinsertion was performed on 02/03/17.  Phone call to pt. Confirmed with pt the removal/reinsertion date for nexplanon was 02/03/2017.

## 2020-03-01 ENCOUNTER — Other Ambulatory Visit: Payer: Self-pay

## 2020-03-01 ENCOUNTER — Ambulatory Visit: Payer: Medicaid Other

## 2020-03-01 ENCOUNTER — Ambulatory Visit (LOCAL_COMMUNITY_HEALTH_CENTER): Payer: Medicaid Other | Admitting: Advanced Practice Midwife

## 2020-03-01 ENCOUNTER — Encounter: Payer: Self-pay | Admitting: Advanced Practice Midwife

## 2020-03-01 VITALS — BP 129/98 | HR 93 | Ht 66.0 in | Wt 170.9 lb

## 2020-03-01 DIAGNOSIS — Z3009 Encounter for other general counseling and advice on contraception: Secondary | ICD-10-CM

## 2020-03-01 DIAGNOSIS — R030 Elevated blood-pressure reading, without diagnosis of hypertension: Secondary | ICD-10-CM | POA: Insufficient documentation

## 2020-03-01 DIAGNOSIS — E663 Overweight: Secondary | ICD-10-CM

## 2020-03-01 DIAGNOSIS — Z3046 Encounter for surveillance of implantable subdermal contraceptive: Secondary | ICD-10-CM

## 2020-03-01 LAB — WET PREP FOR TRICH, YEAST, CLUE
Trichomonas Exam: POSITIVE — AB
Yeast Exam: NEGATIVE

## 2020-03-01 LAB — HEMOGLOBIN, FINGERSTICK: Hemoglobin: 12.4 g/dL (ref 11.1–15.9)

## 2020-03-01 MED ORDER — ETONOGESTREL 68 MG ~~LOC~~ IMPL
68.0000 mg | DRUG_IMPLANT | Freq: Once | SUBCUTANEOUS | Status: AC
  Administered 2020-03-01: 68 mg via SUBCUTANEOUS

## 2020-03-01 NOTE — Progress Notes (Signed)
Presents for PE and Nexplanon removal/reinsertion. Wet prep reviewed with provider, treated for trich per standing order. Nexplanon removed, reinserted, tolerated well. Nexplanon card and instructions given. Sharlyne Pacas, RN

## 2020-03-01 NOTE — Progress Notes (Signed)
[facility] [address] Main Number: [phone]    Family Planning Visit- Initial Visit  Subjective:  Heather Barrett is a 35 y.o.DBF nonsmoker  [id number] (18,13)  being seen today for an initial well woman visit and to discuss family planning options.  She is currently using Nexplanon for pregnancy prevention. Patient reports she does not want a pregnancy in the next year.  Patient has the following medical conditions has Elevated blood pressure reading 03/01/20 129/98 and Overweight BMI=27.5 on their problem list.  Chief Complaint  Patient presents with  . Contraception    Patient reports green increased d/c x 2 mo.  Last PE 09/15/2019.  Last sex 12/2019 without condom; not with that partner anymore but was with him x 6 mo.; 2 sex partners in last year. Nexplanon inserted 01/2017 and now wants removal/reinsertion.  Last pap 01/28/2017 neg HPV neg.  LMP 02/19/20.  Employed 24 hours/wk and living with her 2 children.  Last ETOH 02/23/20 (4 mixed drinks) q weekend.  Tummy tuck 04/2019  Patient denies cigs, vaping, cigars, MJ   Body mass index is 27.58 kg/m. - Patient is eligible for diabetes screening based on BMI and age >35?  not applicable HA1C ordered? not applicable  Patient reports 2  partner/s in last year. Desires STI screening?  Yes  Has patient been screened once for HCV in the past?  No  No results found for: HCVAB  Does the patient have current drug use (including MJ), have a partner with drug use, and/or has been incarcerated since last result? No  If yes-- Screen for HCV through [facility]   Does the patient meet criteria for HBV testing? No  Criteria:  -Household, sexual or needle sharing contact with HBV -History of drug use -HIV positive -Those with known Hep C   Health Maintenance Due  Topic Date Due  . COVID-19 Vaccine (1) Never done  . INFLUENZA VACCINE  08/06/2019    Review of Systems  All other  systems reviewed and are negative.   The following portions of the patient's history were reviewed and updated as appropriate: allergies, current medications, past family history, past medical history, past social history, past surgical history and problem list. Problem list updated.   See flowsheet for other program required questions.  Objective:   Vitals:   03/01/20 1327  BP: (!) 129/98  Pulse: 93  Weight: 170 lb 14.4 oz (77.5 kg)  Height: 5\' 6"  (1.676 m)    Physical Exam Constitutional:      Appearance: Normal appearance. She is normal weight.  HENT:     Head: Normocephalic and atraumatic.     Mouth/Throat:     Mouth: Mucous membranes are moist.  Eyes:     Conjunctiva/sclera: Conjunctivae normal.  Neck:     Comments: Thyroid without masses or enlargement Cervical lymph nodes not palpated Cardiovascular:     Rate and Rhythm: Normal rate and regular rhythm.  Pulmonary:     Effort: Pulmonary effort is normal.     Breath sounds: Normal breath sounds.  Chest:  Breasts:     Right: Normal.     Left: Normal.    Abdominal:     Palpations: Abdomen is soft.     Comments: Scar from tummy tuck 04/2019 Soft, good tone, without masses or tenderness  Genitourinary:    General: Normal vulva.     Exam position: Lithotomy position.     Vagina: Vaginal discharge (sl  green creamy leukorrhea, ph>4.5) present.     Cervix: Normal.     Uterus: Normal.      Adnexa: Right adnexa normal and left adnexa normal.     Rectum: Normal.  Musculoskeletal:        General: Normal range of motion.     Cervical back: Normal range of motion and neck supple.  Skin:    General: Skin is warm and dry.  Neurological:     Mental Status: She is alert.  Psychiatric:        Mood and Affect: Mood normal.       Assessment and Plan:  Heather Barrett is a 35 y.o. female presenting to the [facility] Department for an initial well woman exam/family planning visit  Contraception counseling:  Reviewed all forms of birth control options in the tiered based approach. available including abstinence; over the counter/barrier methods; hormonal contraceptive medication including pill, patch, ring, injection,contraceptive implant, ECP; hormonal and nonhormonal IUDs; permanent sterilization options including vasectomy and the various tubal sterilization modalities. Risks, benefits, and typical effectiveness rates were reviewed.  Questions were answered.  Written information was also given to the patient to review.  Patient desires Nexplanon removal/reinsertion, this was prescribed for patient. She will follow up in  1 year for surveillance.  She was told to call with any further questions, or with any concerns about this method of contraception.  Emphasized use of condoms 100% of the time for STI prevention.  Patient was not  offered ECP. ECP was not accepted by the patient. ECP counseling was not given - see RN documentation  1. Family planning Treat wet mount per standing orders Immunization nurse consult - WET PREP FOR TRICH, YEAST, CLUE - Hemoglobin, venipuncture - Chlamydia/Gonorrhea [place] [facility] - Syphilis Serology, [place] [facility] - [facility]  2. Encounter for surveillance of implantable subdermal contraceptive Nexplanon Removal and Insertion  Patient identified, informed consent performed, consent signed.   Patient does understand that irregular bleeding is a very common side effect of this medication. She was advised to have backup contraception for one week after replacement of the implant. Patient deemed to meet WHO criteria for being reasonably certain she is not pregnant.  Appropriate time out taken. Nexplanon site identified. Area prepped in usual sterile fashon. 3 ml of 1% lidocaine with epinephrine was used to anesthetize the area at the distal end of the implant. A small stab incision was made right beside the implant on the distal portion. The Nexplanon rod was grasped using  hemostats and removed without difficulty. There was minimal blood loss. There were no complications.   Confirmed correct location of insertion site. The insertion site was identified 8-10 cm (3-4 inches) from the medial epicondyle of the humerus and 3-5 cm (1.25-2 inches) posterior to (below) the sulcus (groove) between the biceps and triceps muscles of the patient's right arm. New Nexplanon removed from packaging, Device confirmed in needle, then inserted full length of needle and withdrawn per handbook instructions. Nexplanon was able to palpated in the patient's right arm; patient palpated the insert herself.  There was minimal blood loss. Patient insertion site covered with guaze and a pressure bandage to reduce any bruising. The patient tolerated the procedure well and was given post procedure instructions.    3. Elevated blood pressure reading 03/01/20 129/98 Pt referred to primary care provider for evaluation  4. Overweight BMI=27.5      No follow-ups on file.  No future appointments.  Alberteen Spindle, CNM

## 2022-06-18 ENCOUNTER — Ambulatory Visit (LOCAL_COMMUNITY_HEALTH_CENTER): Payer: Self-pay

## 2022-06-18 DIAGNOSIS — Z0184 Encounter for antibody response examination: Secondary | ICD-10-CM

## 2022-06-18 NOTE — Progress Notes (Signed)
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 Pt requested Varicella titer based on history of chicken pox when she was 37 yrs old. Pt signed ROI, informed pt will call for results. Walked pt to lab, M.Hyun Marsalis, LPN.

## 2022-06-19 ENCOUNTER — Ambulatory Visit: Payer: Medicaid Other

## 2022-06-19 LAB — VARICELLA ZOSTER ANTIBODY, IGG: Varicella zoster IgG: 1744 index (ref >165)

## 2022-06-22 NOTE — Progress Notes (Signed)
Varicella Titer results reviewed,  no vaccine indicated per standing by Dr. Alvester Morin.  Client called at number listed in epic, no answer.  VM message left to call with questions.  Per EPIC client has viewed the  results.   Heather Barrett Sherrilyn Rist, RN
